# Patient Record
Sex: Male | Born: 1935 | Race: White | Hispanic: No | Marital: Married | Smoking: Never smoker
Health system: Southern US, Community
[De-identification: ages and names within clinical notes are randomized; demographics above are authoritative.]

## PROBLEM LIST (undated history)

## (undated) DIAGNOSIS — Z7901 Long term (current) use of anticoagulants: Secondary | ICD-10-CM

## (undated) DIAGNOSIS — E7800 Pure hypercholesterolemia, unspecified: Secondary | ICD-10-CM

## (undated) DIAGNOSIS — E785 Hyperlipidemia, unspecified: Secondary | ICD-10-CM

## (undated) DIAGNOSIS — I10 Essential (primary) hypertension: Secondary | ICD-10-CM

## (undated) DIAGNOSIS — R0600 Dyspnea, unspecified: Secondary | ICD-10-CM

## (undated) DIAGNOSIS — L719 Rosacea, unspecified: Secondary | ICD-10-CM

## (undated) DIAGNOSIS — M5030 Other cervical disc degeneration, unspecified cervical region: Secondary | ICD-10-CM

## (undated) DIAGNOSIS — I428 Other cardiomyopathies: Secondary | ICD-10-CM

## (undated) DIAGNOSIS — I4891 Unspecified atrial fibrillation: Secondary | ICD-10-CM

## (undated) DIAGNOSIS — N289 Disorder of kidney and ureter, unspecified: Secondary | ICD-10-CM

## (undated) DIAGNOSIS — N529 Male erectile dysfunction, unspecified: Secondary | ICD-10-CM

## (undated) HISTORY — DX: Other cervical disc degeneration, unspecified cervical region: M50.30

## (undated) HISTORY — PX: TONSILLECTOMY: SHX5217

## (undated) HISTORY — DX: Male erectile dysfunction, unspecified: N52.9

## (undated) HISTORY — DX: Unspecified atrial fibrillation: I48.91

## (undated) HISTORY — PX: CONTROL HEMORRHAGE NASAL: SUR313

## (undated) HISTORY — DX: Essential (primary) hypertension: I10

## (undated) HISTORY — DX: Dyspnea, unspecified: R06.00

## (undated) HISTORY — DX: Long term (current) use of anticoagulants: Z79.01

## (undated) HISTORY — DX: Other cardiomyopathies: I42.8

## (undated) HISTORY — DX: Hyperlipidemia, unspecified: E78.5

## (undated) HISTORY — DX: Rosacea, unspecified: L71.9

## (undated) HISTORY — PX: APPENDECTOMY: SHX54

## (undated) HISTORY — DX: Pure hypercholesterolemia, unspecified: E78.00

---

## 1998-08-12 ENCOUNTER — Other Ambulatory Visit: Admission: RE | Admit: 1998-08-12 | Discharge: 1998-08-12 | Payer: Self-pay | Admitting: Podiatry

## 2003-02-11 ENCOUNTER — Inpatient Hospital Stay (HOSPITAL_COMMUNITY): Admission: EM | Admit: 2003-02-11 | Discharge: 2003-02-17 | Payer: Self-pay | Admitting: Emergency Medicine

## 2003-02-12 ENCOUNTER — Encounter: Payer: Self-pay | Admitting: Cardiology

## 2003-02-22 ENCOUNTER — Inpatient Hospital Stay (HOSPITAL_COMMUNITY): Admission: AD | Admit: 2003-02-22 | Discharge: 2003-02-24 | Payer: Self-pay | Admitting: Cardiovascular Disease

## 2003-05-14 ENCOUNTER — Ambulatory Visit (HOSPITAL_COMMUNITY): Admission: RE | Admit: 2003-05-14 | Discharge: 2003-05-14 | Payer: Self-pay | Admitting: Cardiovascular Disease

## 2003-07-24 ENCOUNTER — Ambulatory Visit (HOSPITAL_BASED_OUTPATIENT_CLINIC_OR_DEPARTMENT_OTHER): Admission: RE | Admit: 2003-07-24 | Discharge: 2003-07-24 | Payer: Self-pay | Admitting: Urology

## 2003-07-24 ENCOUNTER — Ambulatory Visit (HOSPITAL_COMMUNITY): Admission: RE | Admit: 2003-07-24 | Discharge: 2003-07-24 | Payer: Self-pay | Admitting: Urology

## 2004-01-09 ENCOUNTER — Ambulatory Visit: Payer: Self-pay | Admitting: Family Medicine

## 2004-01-13 ENCOUNTER — Ambulatory Visit: Payer: Self-pay | Admitting: Family Medicine

## 2004-07-22 ENCOUNTER — Ambulatory Visit: Payer: Self-pay | Admitting: Cardiovascular Disease

## 2004-07-24 ENCOUNTER — Ambulatory Visit: Payer: Self-pay | Admitting: Family Medicine

## 2004-08-12 ENCOUNTER — Ambulatory Visit: Payer: Self-pay

## 2004-08-13 ENCOUNTER — Ambulatory Visit: Payer: Self-pay | Admitting: Cardiovascular Disease

## 2004-08-13 ENCOUNTER — Ambulatory Visit: Payer: Self-pay

## 2004-10-29 ENCOUNTER — Ambulatory Visit: Payer: Self-pay | Admitting: Cardiovascular Disease

## 2005-05-06 ENCOUNTER — Ambulatory Visit: Payer: Self-pay | Admitting: Cardiovascular Disease

## 2005-05-25 ENCOUNTER — Ambulatory Visit: Payer: Self-pay

## 2005-05-25 ENCOUNTER — Encounter: Payer: Self-pay | Admitting: Cardiology

## 2005-06-01 ENCOUNTER — Ambulatory Visit: Payer: Self-pay

## 2005-06-07 ENCOUNTER — Ambulatory Visit: Payer: Self-pay | Admitting: Cardiovascular Disease

## 2005-06-14 ENCOUNTER — Ambulatory Visit: Payer: Self-pay | Admitting: Internal Medicine

## 2005-06-29 ENCOUNTER — Ambulatory Visit: Payer: Self-pay | Admitting: Cardiology

## 2005-07-08 ENCOUNTER — Ambulatory Visit: Payer: Self-pay | Admitting: Cardiology

## 2005-07-22 ENCOUNTER — Ambulatory Visit: Payer: Self-pay | Admitting: Internal Medicine

## 2005-08-05 ENCOUNTER — Ambulatory Visit: Payer: Self-pay | Admitting: Cardiology

## 2005-08-19 ENCOUNTER — Ambulatory Visit: Payer: Self-pay | Admitting: Internal Medicine

## 2005-09-02 ENCOUNTER — Ambulatory Visit: Payer: Self-pay | Admitting: Family Medicine

## 2005-09-07 ENCOUNTER — Ambulatory Visit: Payer: Self-pay | Admitting: Cardiology

## 2005-09-28 ENCOUNTER — Ambulatory Visit: Payer: Self-pay | Admitting: Cardiovascular Disease

## 2005-10-12 ENCOUNTER — Ambulatory Visit: Payer: Self-pay | Admitting: Cardiology

## 2005-10-22 ENCOUNTER — Ambulatory Visit: Payer: Self-pay | Admitting: Cardiology

## 2005-11-09 ENCOUNTER — Ambulatory Visit: Payer: Self-pay | Admitting: Cardiology

## 2005-11-09 ENCOUNTER — Ambulatory Visit: Payer: Self-pay | Admitting: Family Medicine

## 2005-11-09 LAB — CONVERTED CEMR LAB
ALT: 25 units/L (ref 0–40)
AST: 30 units/L (ref 0–37)
Alkaline Phosphatase: 60 units/L (ref 39–117)
BUN: 10 mg/dL (ref 6–23)
Basophils Absolute: 0 10*3/uL (ref 0.0–0.1)
Basophils Relative: 0.7 % (ref 0.0–1.0)
Calcium: 8.8 mg/dL (ref 8.4–10.5)
Chol/HDL Ratio, serum: 4.2
Cholesterol: 208 mg/dL (ref 0–200)
Creatinine, Ser: 1.1 mg/dL (ref 0.4–1.5)
Eosinophil percent: 3.8 % (ref 0.0–5.0)
Glucose, Bld: 98 mg/dL (ref 70–99)
HCT: 45 % (ref 39.0–52.0)
HDL: 49.5 mg/dL (ref >39.0)
Hemoglobin: 15.1 g/dL (ref 13.0–17.0)
LDL DIRECT: 122.2 mg/dL
Lymphocytes Relative: 21.2 % (ref 12.0–46.0)
MCHC: 33.4 g/dL (ref 30.0–36.0)
MCV: 93.3 fL (ref 78.0–100.0)
Monocytes Absolute: 0.5 10*3/uL (ref 0.2–0.7)
Monocytes Relative: 11 % (ref 3.0–11.0)
Neutro Abs: 2.9 10*3/uL (ref 1.4–7.7)
Neutrophils Relative %: 63.3 % (ref 43.0–77.0)
Platelets: 178 10*3/uL (ref 150–400)
Potassium: 4.1 meq/L (ref 3.5–5.1)
RBC: 4.83 M/uL (ref 4.22–5.81)
RDW: 13.8 % (ref 11.5–14.6)
TSH: 1.81 microintl units/mL (ref 0.35–5.50)
Triglyceride fasting, serum: 157 mg/dL — ABNORMAL HIGH (ref 0–149)
VLDL: 31 mg/dL (ref 0–40)
WBC: 4.6 10*3/uL (ref 4.5–10.5)

## 2005-11-29 ENCOUNTER — Ambulatory Visit: Payer: Self-pay | Admitting: Cardiology

## 2005-12-02 ENCOUNTER — Ambulatory Visit: Payer: Self-pay | Admitting: Cardiovascular Disease

## 2005-12-21 ENCOUNTER — Ambulatory Visit: Payer: Self-pay | Admitting: Cardiology

## 2005-12-30 ENCOUNTER — Ambulatory Visit: Payer: Self-pay | Admitting: Cardiovascular Disease

## 2006-01-14 ENCOUNTER — Ambulatory Visit: Payer: Self-pay | Admitting: Cardiovascular Disease

## 2006-02-15 ENCOUNTER — Ambulatory Visit: Payer: Self-pay | Admitting: *Deleted

## 2006-03-14 ENCOUNTER — Ambulatory Visit: Payer: Self-pay | Admitting: Internal Medicine

## 2006-04-12 ENCOUNTER — Ambulatory Visit: Payer: Self-pay | Admitting: Cardiology

## 2006-05-20 ENCOUNTER — Ambulatory Visit: Payer: Self-pay | Admitting: Cardiology

## 2006-06-06 ENCOUNTER — Ambulatory Visit: Payer: Self-pay | Admitting: Cardiovascular Disease

## 2006-06-15 ENCOUNTER — Ambulatory Visit: Payer: Self-pay | Admitting: Family Medicine

## 2006-06-17 ENCOUNTER — Ambulatory Visit: Payer: Self-pay | Admitting: Internal Medicine

## 2006-07-15 ENCOUNTER — Ambulatory Visit: Payer: Self-pay | Admitting: Cardiovascular Disease

## 2006-08-12 ENCOUNTER — Ambulatory Visit: Payer: Self-pay | Admitting: Internal Medicine

## 2006-09-09 ENCOUNTER — Ambulatory Visit: Payer: Self-pay | Admitting: Cardiology

## 2006-10-07 ENCOUNTER — Ambulatory Visit: Payer: Self-pay | Admitting: Cardiology

## 2006-11-04 ENCOUNTER — Ambulatory Visit: Payer: Self-pay | Admitting: Cardiology

## 2006-12-02 ENCOUNTER — Ambulatory Visit: Payer: Self-pay | Admitting: Cardiology

## 2006-12-13 ENCOUNTER — Ambulatory Visit: Payer: Self-pay | Admitting: Cardiovascular Disease

## 2006-12-30 ENCOUNTER — Ambulatory Visit: Payer: Self-pay | Admitting: Cardiology

## 2007-01-02 ENCOUNTER — Ambulatory Visit: Payer: Self-pay | Admitting: Family Medicine

## 2007-01-02 DIAGNOSIS — E782 Mixed hyperlipidemia: Secondary | ICD-10-CM | POA: Insufficient documentation

## 2007-01-02 DIAGNOSIS — I10 Essential (primary) hypertension: Secondary | ICD-10-CM | POA: Insufficient documentation

## 2007-01-02 DIAGNOSIS — L719 Rosacea, unspecified: Secondary | ICD-10-CM | POA: Insufficient documentation

## 2007-01-02 DIAGNOSIS — I4891 Unspecified atrial fibrillation: Secondary | ICD-10-CM | POA: Insufficient documentation

## 2007-01-02 DIAGNOSIS — N529 Male erectile dysfunction, unspecified: Secondary | ICD-10-CM | POA: Insufficient documentation

## 2007-01-02 LAB — CONVERTED CEMR LAB
ALT: 22 units/L (ref 0–53)
AST: 28 units/L (ref 0–37)
Albumin: 4 g/dL (ref 3.5–5.2)
Alkaline Phosphatase: 72 units/L (ref 39–117)
BUN: 15 mg/dL (ref 6–23)
Basophils Absolute: 0 10*3/uL (ref 0.0–0.1)
Basophils Relative: 0.2 % (ref 0.0–1.0)
Bilirubin, Direct: 0.3 mg/dL (ref 0.0–0.3)
CO2: 30 meq/L (ref 19–32)
Calcium: 9.4 mg/dL (ref 8.4–10.5)
Chloride: 100 meq/L (ref 96–112)
Cholesterol: 182 mg/dL (ref 0–200)
Creatinine, Ser: 1.2 mg/dL (ref 0.4–1.5)
Eosinophils Absolute: 0.2 10*3/uL (ref 0.0–0.6)
Eosinophils Relative: 2.3 % (ref 0.0–5.0)
GFR calc Af Amer: 77 mL/min
GFR calc non Af Amer: 63 mL/min
Glucose, Bld: 99 mg/dL (ref 70–99)
HCT: 42.8 % (ref 39.0–52.0)
HDL: 42.9 mg/dL (ref >39.0)
Hemoglobin: 14.9 g/dL (ref 13.0–17.0)
LDL Cholesterol: 111 mg/dL — ABNORMAL HIGH (ref 0–99)
Lymphocytes Relative: 19 % (ref 12.0–46.0)
MCHC: 34.8 g/dL (ref 30.0–36.0)
MCV: 92.1 fL (ref 78.0–100.0)
Monocytes Absolute: 0.6 10*3/uL (ref 0.2–0.7)
Monocytes Relative: 8.7 % (ref 3.0–11.0)
Neutro Abs: 4.9 10*3/uL (ref 1.4–7.7)
Neutrophils Relative %: 69.8 % (ref 43.0–77.0)
Platelets: 197 10*3/uL (ref 150–400)
Potassium: 4.4 meq/L (ref 3.5–5.1)
RBC: 4.65 M/uL (ref 4.22–5.81)
RDW: 13.2 % (ref 11.5–14.6)
Sodium: 138 meq/L (ref 135–145)
TSH: 1.41 microintl units/mL (ref 0.35–5.50)
Total Bilirubin: 1 mg/dL (ref 0.3–1.2)
Total CHOL/HDL Ratio: 4.2
Total Protein: 6.3 g/dL (ref 6.0–8.3)
Triglycerides: 141 mg/dL (ref 0–149)
VLDL: 28 mg/dL (ref 0–40)
WBC: 7 10*3/uL (ref 4.5–10.5)

## 2007-01-27 ENCOUNTER — Ambulatory Visit: Payer: Self-pay | Admitting: Cardiology

## 2007-01-31 ENCOUNTER — Telehealth (INDEPENDENT_AMBULATORY_CARE_PROVIDER_SITE_OTHER): Payer: Self-pay | Admitting: *Deleted

## 2007-02-02 ENCOUNTER — Telehealth (INDEPENDENT_AMBULATORY_CARE_PROVIDER_SITE_OTHER): Payer: Self-pay | Admitting: *Deleted

## 2007-02-24 ENCOUNTER — Ambulatory Visit: Payer: Self-pay | Admitting: Cardiology

## 2007-03-20 ENCOUNTER — Telehealth: Payer: Self-pay | Admitting: Family Medicine

## 2007-03-24 ENCOUNTER — Ambulatory Visit: Payer: Self-pay | Admitting: Internal Medicine

## 2007-04-24 ENCOUNTER — Ambulatory Visit: Payer: Self-pay | Admitting: Cardiology

## 2007-05-26 ENCOUNTER — Ambulatory Visit: Payer: Self-pay | Admitting: Cardiovascular Disease

## 2007-05-29 ENCOUNTER — Ambulatory Visit: Payer: Self-pay | Admitting: Internal Medicine

## 2007-07-03 ENCOUNTER — Ambulatory Visit: Payer: Self-pay | Admitting: Cardiology

## 2007-07-31 ENCOUNTER — Ambulatory Visit: Payer: Self-pay | Admitting: Cardiology

## 2007-09-04 ENCOUNTER — Ambulatory Visit: Payer: Self-pay | Admitting: Cardiology

## 2007-10-03 ENCOUNTER — Ambulatory Visit: Payer: Self-pay | Admitting: Cardiology

## 2007-10-31 ENCOUNTER — Ambulatory Visit: Payer: Self-pay | Admitting: Cardiovascular Disease

## 2007-11-28 ENCOUNTER — Ambulatory Visit: Payer: Self-pay | Admitting: Internal Medicine

## 2007-12-26 ENCOUNTER — Ambulatory Visit: Payer: Self-pay | Admitting: Cardiovascular Disease

## 2008-01-11 ENCOUNTER — Ambulatory Visit: Payer: Self-pay | Admitting: Cardiovascular Disease

## 2008-01-11 ENCOUNTER — Ambulatory Visit: Payer: Self-pay | Admitting: Cardiology

## 2008-01-25 ENCOUNTER — Ambulatory Visit: Payer: Self-pay | Admitting: Internal Medicine

## 2008-02-22 ENCOUNTER — Ambulatory Visit: Payer: Self-pay | Admitting: Internal Medicine

## 2008-02-29 ENCOUNTER — Ambulatory Visit: Payer: Self-pay | Admitting: Family Medicine

## 2008-03-01 LAB — CONVERTED CEMR LAB
ALT: 21 units/L (ref 0–53)
AST: 30 units/L (ref 0–37)
Albumin: 4 g/dL (ref 3.5–5.2)
Alkaline Phosphatase: 51 units/L (ref 39–117)
BUN: 17 mg/dL (ref 6–23)
Basophils Absolute: 0 10*3/uL (ref 0.0–0.1)
Basophils Relative: 0.5 % (ref 0.0–3.0)
Bilirubin, Direct: 0.1 mg/dL (ref 0.0–0.3)
CO2: 0 meq/L — CL (ref 19–32)
Calcium: 9.4 mg/dL (ref 8.4–10.5)
Chloride: 111 meq/L (ref 96–112)
Cholesterol: 202 mg/dL (ref 0–200)
Creatinine, Ser: 1.1 mg/dL (ref 0.4–1.5)
Direct LDL: 119.8 mg/dL
Eosinophils Absolute: 0.1 10*3/uL (ref 0.0–0.7)
Eosinophils Relative: 2.2 % (ref 0.0–5.0)
GFR calc Af Amer: 84 mL/min
GFR calc non Af Amer: 70 mL/min
Glucose, Bld: 94 mg/dL (ref 70–99)
HCT: 42.6 % (ref 39.0–52.0)
HDL: 51.2 mg/dL (ref >39.0)
Hemoglobin: 14.5 g/dL (ref 13.0–17.0)
Lymphocytes Relative: 24.8 % (ref 12.0–46.0)
MCHC: 34.1 g/dL (ref 30.0–36.0)
MCV: 92.5 fL (ref 78.0–100.0)
Monocytes Absolute: 0.4 10*3/uL (ref 0.1–1.0)
Monocytes Relative: 10.2 % (ref 3.0–12.0)
Neutro Abs: 2.6 10*3/uL (ref 1.4–7.7)
Neutrophils Relative %: 62.3 % (ref 43.0–77.0)
Platelets: 165 10*3/uL (ref 150–400)
Potassium: 5.1 meq/L (ref 3.5–5.1)
RBC: 4.6 M/uL (ref 4.22–5.81)
RDW: 13.4 % (ref 11.5–14.6)
Sodium: 145 meq/L (ref 135–145)
TSH: 1.54 microintl units/mL (ref 0.35–5.50)
Total Bilirubin: 1 mg/dL (ref 0.3–1.2)
Total CHOL/HDL Ratio: 3.9
Total Protein: 6.6 g/dL (ref 6.0–8.3)
Triglycerides: 124 mg/dL (ref 0–149)
VLDL: 25 mg/dL (ref 0–40)
WBC: 4.1 10*3/uL — ABNORMAL LOW (ref 4.5–10.5)

## 2008-03-21 ENCOUNTER — Ambulatory Visit: Payer: Self-pay | Admitting: Internal Medicine

## 2008-04-11 ENCOUNTER — Ambulatory Visit: Payer: Self-pay | Admitting: Internal Medicine

## 2008-04-25 ENCOUNTER — Ambulatory Visit: Payer: Self-pay | Admitting: Cardiovascular Disease

## 2008-05-01 ENCOUNTER — Ambulatory Visit: Payer: Self-pay | Admitting: Family Medicine

## 2008-05-08 LAB — CONVERTED CEMR LAB
Fecal Occult Blood: NEGATIVE
OCCULT 1: NEGATIVE
OCCULT 2: NEGATIVE
OCCULT 3: NEGATIVE
OCCULT 4: NEGATIVE
OCCULT 5: NEGATIVE

## 2008-05-14 ENCOUNTER — Ambulatory Visit: Payer: Self-pay | Admitting: Cardiology

## 2008-06-13 ENCOUNTER — Ambulatory Visit: Payer: Self-pay | Admitting: Internal Medicine

## 2008-06-25 ENCOUNTER — Encounter: Payer: Self-pay | Admitting: *Deleted

## 2008-07-08 ENCOUNTER — Ambulatory Visit: Payer: Self-pay | Admitting: Cardiology

## 2008-07-08 ENCOUNTER — Telehealth: Payer: Self-pay | Admitting: Family Medicine

## 2008-07-08 ENCOUNTER — Ambulatory Visit: Payer: Self-pay | Admitting: Family Medicine

## 2008-07-08 DIAGNOSIS — M5020 Other cervical disc displacement, unspecified cervical region: Secondary | ICD-10-CM

## 2008-07-08 LAB — CONVERTED CEMR LAB
POC INR: 2.7
Protime: 19.8

## 2008-07-18 ENCOUNTER — Encounter: Admission: RE | Admit: 2008-07-18 | Discharge: 2008-08-29 | Payer: Self-pay | Admitting: Family Medicine

## 2008-07-30 ENCOUNTER — Encounter (INDEPENDENT_AMBULATORY_CARE_PROVIDER_SITE_OTHER): Payer: Self-pay | Admitting: Cardiology

## 2008-07-30 ENCOUNTER — Ambulatory Visit: Payer: Self-pay | Admitting: Cardiology

## 2008-07-30 LAB — CONVERTED CEMR LAB
POC INR: 4.7
Prothrombin Time: 26.4 s

## 2008-07-31 ENCOUNTER — Encounter: Payer: Self-pay | Admitting: *Deleted

## 2008-08-01 ENCOUNTER — Telehealth (INDEPENDENT_AMBULATORY_CARE_PROVIDER_SITE_OTHER): Payer: Self-pay | Admitting: *Deleted

## 2008-08-06 ENCOUNTER — Encounter: Payer: Self-pay | Admitting: Cardiology

## 2008-08-06 ENCOUNTER — Encounter (INDEPENDENT_AMBULATORY_CARE_PROVIDER_SITE_OTHER): Payer: Self-pay | Admitting: Cardiology

## 2008-09-02 ENCOUNTER — Ambulatory Visit: Payer: Self-pay | Admitting: Cardiology

## 2008-09-02 LAB — CONVERTED CEMR LAB
POC INR: 2.8
Prothrombin Time: 20.2 s

## 2008-09-18 DIAGNOSIS — R0602 Shortness of breath: Secondary | ICD-10-CM | POA: Insufficient documentation

## 2008-09-19 ENCOUNTER — Ambulatory Visit: Payer: Self-pay | Admitting: Cardiovascular Disease

## 2008-10-01 ENCOUNTER — Ambulatory Visit: Payer: Self-pay | Admitting: Cardiology

## 2008-10-01 LAB — CONVERTED CEMR LAB: POC INR: 3.2

## 2008-11-08 ENCOUNTER — Ambulatory Visit: Payer: Self-pay | Admitting: Internal Medicine

## 2008-11-08 LAB — CONVERTED CEMR LAB: POC INR: 3.5

## 2008-12-09 ENCOUNTER — Ambulatory Visit: Payer: Self-pay | Admitting: Cardiology

## 2008-12-09 LAB — CONVERTED CEMR LAB: POC INR: 2.9

## 2008-12-17 ENCOUNTER — Encounter (INDEPENDENT_AMBULATORY_CARE_PROVIDER_SITE_OTHER): Payer: Self-pay | Admitting: *Deleted

## 2009-01-07 ENCOUNTER — Ambulatory Visit: Payer: Self-pay | Admitting: Cardiology

## 2009-01-07 LAB — CONVERTED CEMR LAB: POC INR: 3.2

## 2009-02-06 ENCOUNTER — Ambulatory Visit: Payer: Self-pay | Admitting: Internal Medicine

## 2009-02-06 LAB — CONVERTED CEMR LAB: POC INR: 3.2

## 2009-03-06 ENCOUNTER — Ambulatory Visit: Payer: Self-pay | Admitting: Cardiology

## 2009-03-06 ENCOUNTER — Encounter (INDEPENDENT_AMBULATORY_CARE_PROVIDER_SITE_OTHER): Payer: Self-pay | Admitting: *Deleted

## 2009-03-06 ENCOUNTER — Ambulatory Visit: Payer: Self-pay | Admitting: Family Medicine

## 2009-03-06 ENCOUNTER — Encounter (INDEPENDENT_AMBULATORY_CARE_PROVIDER_SITE_OTHER): Payer: Self-pay | Admitting: Cardiology

## 2009-03-06 LAB — CONVERTED CEMR LAB: POC INR: 2.9

## 2009-03-07 LAB — CONVERTED CEMR LAB
ALT: 34 units/L (ref 0–53)
AST: 34 units/L (ref 0–37)
Albumin: 4.1 g/dL (ref 3.5–5.2)
Alkaline Phosphatase: 62 units/L (ref 39–117)
BUN: 11 mg/dL (ref 6–23)
Basophils Absolute: 0 10*3/uL (ref 0.0–0.1)
Basophils Relative: 0.5 % (ref 0.0–3.0)
Bilirubin, Direct: 0.1 mg/dL (ref 0.0–0.3)
CO2: 32 meq/L (ref 19–32)
Calcium: 9 mg/dL (ref 8.4–10.5)
Chloride: 103 meq/L (ref 96–112)
Cholesterol: 209 mg/dL — ABNORMAL HIGH (ref 0–200)
Creatinine, Ser: 1 mg/dL (ref 0.4–1.5)
Direct LDL: 128.1 mg/dL
Eosinophils Absolute: 0.2 10*3/uL (ref 0.0–0.7)
Eosinophils Relative: 3.5 % (ref 0.0–5.0)
GFR calc non Af Amer: 77.62 mL/min (ref >60)
Glucose, Bld: 88 mg/dL (ref 70–99)
HCT: 42.3 % (ref 39.0–52.0)
HDL: 58.3 mg/dL (ref >39.00)
Hemoglobin: 14.2 g/dL (ref 13.0–17.0)
Lymphocytes Relative: 24.3 % (ref 12.0–46.0)
Lymphs Abs: 1.1 10*3/uL (ref 0.7–4.0)
MCHC: 33.6 g/dL (ref 30.0–36.0)
MCV: 92.9 fL (ref 78.0–100.0)
Monocytes Absolute: 0.5 10*3/uL (ref 0.1–1.0)
Monocytes Relative: 11 % (ref 3.0–12.0)
Neutro Abs: 2.7 10*3/uL (ref 1.4–7.7)
Neutrophils Relative %: 60.7 % (ref 43.0–77.0)
Platelets: 132 10*3/uL — ABNORMAL LOW (ref 150.0–400.0)
Potassium: 4.3 meq/L (ref 3.5–5.1)
RBC: 4.55 M/uL (ref 4.22–5.81)
RDW: 14.2 % (ref 11.5–14.6)
Sodium: 142 meq/L (ref 135–145)
TSH: 2.06 microintl units/mL (ref 0.35–5.50)
Total Bilirubin: 1 mg/dL (ref 0.3–1.2)
Total CHOL/HDL Ratio: 4
Total Protein: 6.4 g/dL (ref 6.0–8.3)
Triglycerides: 166 mg/dL — ABNORMAL HIGH (ref 0.0–149.0)
VLDL: 33.2 mg/dL (ref 0.0–40.0)
WBC: 4.5 10*3/uL (ref 4.5–10.5)

## 2009-04-01 ENCOUNTER — Encounter (INDEPENDENT_AMBULATORY_CARE_PROVIDER_SITE_OTHER): Payer: Self-pay | Admitting: *Deleted

## 2009-04-01 ENCOUNTER — Ambulatory Visit: Payer: Self-pay | Admitting: Gastroenterology

## 2009-04-01 DIAGNOSIS — K625 Hemorrhage of anus and rectum: Secondary | ICD-10-CM

## 2009-04-03 ENCOUNTER — Ambulatory Visit: Payer: Self-pay | Admitting: Cardiology

## 2009-04-03 LAB — CONVERTED CEMR LAB: POC INR: 2.5

## 2009-04-07 ENCOUNTER — Telehealth: Payer: Self-pay | Admitting: Family Medicine

## 2009-04-14 ENCOUNTER — Telehealth (INDEPENDENT_AMBULATORY_CARE_PROVIDER_SITE_OTHER): Payer: Self-pay | Admitting: *Deleted

## 2009-04-15 ENCOUNTER — Ambulatory Visit: Payer: Self-pay | Admitting: Gastroenterology

## 2009-04-18 ENCOUNTER — Encounter: Payer: Self-pay | Admitting: Gastroenterology

## 2009-04-28 ENCOUNTER — Telehealth: Payer: Self-pay | Admitting: Family Medicine

## 2009-04-29 ENCOUNTER — Ambulatory Visit: Payer: Self-pay | Admitting: Internal Medicine

## 2009-04-29 LAB — CONVERTED CEMR LAB: POC INR: 2.9

## 2009-05-11 ENCOUNTER — Emergency Department (HOSPITAL_COMMUNITY): Admission: EM | Admit: 2009-05-11 | Discharge: 2009-05-11 | Payer: Self-pay | Admitting: Emergency Medicine

## 2009-05-27 ENCOUNTER — Ambulatory Visit: Payer: Self-pay | Admitting: Cardiology

## 2009-05-27 LAB — CONVERTED CEMR LAB: POC INR: 3.7

## 2009-06-24 ENCOUNTER — Ambulatory Visit: Payer: Self-pay | Admitting: Internal Medicine

## 2009-06-24 LAB — CONVERTED CEMR LAB: POC INR: 2.2

## 2009-07-14 ENCOUNTER — Encounter (INDEPENDENT_AMBULATORY_CARE_PROVIDER_SITE_OTHER): Payer: Self-pay | Admitting: *Deleted

## 2009-07-22 ENCOUNTER — Ambulatory Visit: Payer: Self-pay | Admitting: Internal Medicine

## 2009-07-22 LAB — CONVERTED CEMR LAB: POC INR: 2.6

## 2009-08-13 ENCOUNTER — Ambulatory Visit: Payer: Self-pay | Admitting: Family Medicine

## 2009-08-13 DIAGNOSIS — M109 Gout, unspecified: Secondary | ICD-10-CM

## 2009-08-15 LAB — CONVERTED CEMR LAB
BUN: 16 mg/dL (ref 6–23)
Basophils Absolute: 0 10*3/uL (ref 0.0–0.1)
Basophils Relative: 0.3 % (ref 0.0–3.0)
CO2: 30 meq/L (ref 19–32)
Calcium: 8.4 mg/dL (ref 8.4–10.5)
Chloride: 101 meq/L (ref 96–112)
Creatinine, Ser: 1 mg/dL (ref 0.4–1.5)
Eosinophils Absolute: 0 10*3/uL (ref 0.0–0.7)
Eosinophils Relative: 0.4 % (ref 0.0–5.0)
GFR calc non Af Amer: 81.27 mL/min (ref >60)
Glucose, Bld: 81 mg/dL (ref 70–99)
HCT: 39.5 % (ref 39.0–52.0)
Hemoglobin: 13.3 g/dL (ref 13.0–17.0)
Lymphocytes Relative: 10.2 % — ABNORMAL LOW (ref 12.0–46.0)
Lymphs Abs: 0.9 10*3/uL (ref 0.7–4.0)
MCHC: 33.8 g/dL (ref 30.0–36.0)
MCV: 93.6 fL (ref 78.0–100.0)
Monocytes Absolute: 0.9 10*3/uL (ref 0.1–1.0)
Monocytes Relative: 9.9 % (ref 3.0–12.0)
Neutro Abs: 7.1 10*3/uL (ref 1.4–7.7)
Neutrophils Relative %: 79.2 % — ABNORMAL HIGH (ref 43.0–77.0)
Platelets: 139 10*3/uL — ABNORMAL LOW (ref 150.0–400.0)
Potassium: 3.5 meq/L (ref 3.5–5.1)
RBC: 4.22 M/uL (ref 4.22–5.81)
RDW: 15.6 % — ABNORMAL HIGH (ref 11.5–14.6)
Sodium: 137 meq/L (ref 135–145)
Uric Acid, Serum: 4.9 mg/dL (ref 4.0–7.8)
WBC: 9 10*3/uL (ref 4.5–10.5)

## 2009-08-19 ENCOUNTER — Ambulatory Visit: Payer: Self-pay | Admitting: Cardiology

## 2009-08-19 LAB — CONVERTED CEMR LAB: POC INR: 3.3

## 2009-09-11 ENCOUNTER — Ambulatory Visit: Payer: Self-pay | Admitting: Cardiovascular Disease

## 2009-09-11 LAB — CONVERTED CEMR LAB: POC INR: 2.5

## 2009-09-22 ENCOUNTER — Ambulatory Visit: Payer: Self-pay | Admitting: Cardiovascular Disease

## 2009-09-22 ENCOUNTER — Ambulatory Visit: Payer: Self-pay

## 2009-09-22 ENCOUNTER — Ambulatory Visit (HOSPITAL_COMMUNITY): Admission: RE | Admit: 2009-09-22 | Discharge: 2009-09-22 | Payer: Self-pay | Admitting: Cardiovascular Disease

## 2009-09-22 ENCOUNTER — Ambulatory Visit: Payer: Self-pay | Admitting: Cardiology

## 2009-10-07 ENCOUNTER — Ambulatory Visit: Payer: Self-pay | Admitting: Cardiovascular Disease

## 2009-10-07 LAB — CONVERTED CEMR LAB: POC INR: 2.7

## 2009-10-31 ENCOUNTER — Telehealth: Payer: Self-pay | Admitting: Family Medicine

## 2009-11-04 ENCOUNTER — Ambulatory Visit: Payer: Self-pay | Admitting: Cardiology

## 2009-11-04 LAB — CONVERTED CEMR LAB: POC INR: 2.8

## 2009-11-12 ENCOUNTER — Telehealth: Payer: Self-pay | Admitting: Family Medicine

## 2009-12-02 ENCOUNTER — Ambulatory Visit: Payer: Self-pay | Admitting: Internal Medicine

## 2009-12-02 LAB — CONVERTED CEMR LAB: POC INR: 2.5

## 2009-12-30 ENCOUNTER — Ambulatory Visit: Payer: Self-pay | Admitting: Cardiovascular Disease

## 2009-12-30 LAB — CONVERTED CEMR LAB: POC INR: 2.2

## 2010-02-03 ENCOUNTER — Ambulatory Visit: Admission: RE | Admit: 2010-02-03 | Discharge: 2010-02-03 | Payer: Self-pay | Source: Home / Self Care

## 2010-02-03 LAB — CONVERTED CEMR LAB: POC INR: 2.3

## 2010-02-14 ENCOUNTER — Encounter: Payer: Self-pay | Admitting: Urology

## 2010-02-24 NOTE — Medication Information (Signed)
Summary: rov/ewj  Anticoagulant Therapy  Managed by: Shelby Dubin, PharmD, BCPS, CPP Referring MD: Charlton Haws MD Supervising MD: Daleen Squibb MD, Maisie Fus Indication 1: Atrial Fibrillation (ICD-427.31) Lab Used: [facility] Site: [facility] INR POC 2.9 INR RANGE 2 - 3  Dietary changes: yes       Details: more greens...  Health status changes: yes       Details: physical this morning  Bleeding/hemorrhagic complications: yes       Details: occasional hemrrhoidal and nasal bleeding..not concerning to patient  Recent/future hospitalizations: no    Any changes in medication regimen? no    Recent/future dental: no  Any missed doses?: no       Is patient compliant with meds? yes       Current Medications (verified): 1)  K-Phos-Neutral [phone] Mg Tabs (K Phos Di & Mono-Sod Phos Mono) .Marland Kitchen.. 1 Tab By Mouth Once Daily As Needed 2)  Metrogel 1 %  Gel (Metronidazole) .... Uad 3)  Simvastatin 10 Mg  Tabs (Simvastatin) .... Take 1 Tab By Mouth At Bedtime 4)  Multivitamins   Tabs (Multiple Vitamin) .Marland Kitchen.. 1 Tab By Mouth Once Daily 5)  Metoprolol Succinate 50 Mg  Tb24 (Metoprolol Succinate) .... 1/2 Tab Two Times A Day 6)  Viagra 100 Mg  Tabs (Sildenafil Citrate) .... Uad 7)  Vitamin A & D 5000-400 Unit Caps (Vitamins A & D) .... Take On Tab Once Daily 8)  Warfarin Sodium 3 Mg Tabs (Warfarin Sodium) .... Use As Directed By Anticoagulation Clinic 9)  Cinnamon 500 Mg Caps (Cinnamon) .... Take One Tab By Mouth Qd 10)  Turmeric Curcumin  Caps (Misc Natural Products) .... Take One Tab By Mouth Qd 11)  Vitamin D 5,000 International Units .... Take One Tab By Mouth Once Daily 12)  Vitamin E 400 Unit Caps (Vitamin E) .... Take One By Mouth Qd 13)  Saw Palmetto 450 Mg Caps (Saw Palmetto (Serenoa Repens)) .Marland Kitchen.. 1 By Mouth Daily 14)  Super Oat Bran 1000 Mg Tabs (Misc Natural Products) .... 850 Mg Dose Daily 15)  M2 Manganese 4 Mg Tabs (Misc Natural Products) .... 2 Mg By Mouth Daily 16)  Niacin Cr 1000 Mg  Cr-Tabs (Niacin) .... 1/2 Tab By Mouth Once Daily 17)  Gamma-Linc 500 500 Mg Caps (Black Currant Seed Oil) .... 535 Mg Daily 18)  Ginkgo Biloba 120 Mg Caps (Ginkgo Biloba) .Marland Kitchen.. 120 Mg Daily (Sometimes) 19)  Omega-3 350 Mg Caps (Omega-3 Fatty Acids) .... Once Daily 20)  Zinc 100 Mg Tabs (Zinc) .... Once Daily  Allergies (verified): No Known Drug Allergies  Anticoagulation Management History:      The patient is taking warfarin and comes in today for a routine follow up visit.  Positive risk factors for bleeding include an age of 29 years or older.  The bleeding index is 'intermediate risk'.  Positive CHADS2 values include History of HTN.  Negative CHADS2 values include Age > 16 years old.  The start date was 02/16/2003.  Anticoagulation responsible provider: Daleen Squibb MD, Maisie Fus.  INR POC: 2.9.  Cuvette Lot#: [id number].  Exp: 04/2010.    Anticoagulation Management Assessment/Plan:      The patient's current anticoagulation dose is Warfarin sodium 3 mg tabs: Use as directed by Anticoagulation Clinic.  The target INR is 2 - 3.  The next INR is due 04/03/2009.  Anticoagulation instructions were given to patient.  Results were reviewed/authorized by Shelby Dubin, PharmD, BCPS, CPP.  He was notified by Shelby Dubin PharmD, BCPS, CPP.  Prior Anticoagulation Instructions: INR 3.2  Take 1/2 tablet daily then resume same dosage 1.5 tablets daily.  Recheck in 4 weeks.    Current Anticoagulation Instructions: INR 2.9  Please continue taking 1.5 tabs daily and your regular greens intake.   Recheck 4 weeks.

## 2010-02-24 NOTE — Medication Information (Signed)
Summary: Chad Tucker      Allergies Added: NKDA Anticoagulant Therapy  Managed by: Weston Brass, PharmD Referring MD: Charlton Haws MD PCP: Kelle Darting, MD Supervising MD: Graciela Husbands MD, Viviann Spare Indication 1: Atrial Fibrillation (ICD-427.31) Lab Used: [facility] Site: [facility] INR POC 2.5 INR RANGE 2 - 3  Dietary changes: no    Health status changes: no    Bleeding/hemorrhagic complications: yes       Details: [place]osebleeds [place] but bleeding stopped with no problems  Recent/future hospitalizations: no    Any changes in medication regimen? no    Recent/future dental: no  Any missed doses?: no       Is patient compliant with meds? yes       Allergies (verified): No Known Drug Allergies  Anticoagulation Management History:      The patient is taking warfarin and comes in today for a routine follow up visit.  Positive risk factors for bleeding include an age of 75 years or older.  The bleeding index is 'intermediate risk'.  Positive CHADS2 values include History of HTN.  Negative CHADS2 values include Age > 57 years old.  The start date was 02/16/2003.  Anticoagulation responsible provider: Graciela Husbands MD, Viviann Spare.  INR POC: 2.5.  Cuvette Lot#: [id number].  Exp: 11/2010.    Anticoagulation Management Assessment/Plan:      The patient's current anticoagulation dose is Warfarin sodium 3 mg tabs: Use as directed by Anticoagulation Clinic.  The target INR is 2 - 3.  The next INR is due 12/30/2009.  Anticoagulation instructions were given to patient.  Results were reviewed/authorized by Weston Brass, PharmD.  He was notified by Hoy Register, PharmD Candidate.         Prior Anticoagulation Instructions: INR 2.8  Continue same dose of 1 1/2 tablets every day.  Recheck INR in 4 weeks.   Current Anticoagulation Instructions: INR 2.5 Continue previous dose of 1.5 tablets everyday Recheck INR in 4 weeks

## 2010-02-24 NOTE — Progress Notes (Signed)
Summary: Pt req other 2 tubes for Metrogel - [facility] mail order  Phone Note Call from Patient   Caller: Patient Summary of Call: Pt called and said that mail order pharmacy service sent refill req 3 times. Pt is req Metrogel. Need permission to compress script from 30day supply to 90day supply. Pt rcvd 1 tube and is req the other 2. Please call in to [facility] [phone].   Initial call taken by: Lucy Antigua,  April 28, 2009 11:58 AM  Follow-up for Phone Call        whatever!!!!!!!!!!!!!! Follow-up by: Roderick Pee MD,  April 28, 2009 1:14 PM    Prescriptions: METROGEL 1 %  GEL (METRONIDAZOLE) use as directed  #90 days x 3   Entered by:   Kern Reap CMA (AAMA)   Authorized by:   Roderick Pee MD   Signed by:   Kern Reap CMA (AAMA) on 04/28/2009   Method used:   Electronically to        [facility]* (mail-order)       [address] [place], [place]  [address]       Ph: [phone]       Fax: [phone]   RxID:   [id number]

## 2010-02-24 NOTE — Letter (Signed)
Summary: [facility] Instructions  [facility]  [address] [place], [place] [address]   Phone: [phone]  Fax: [phone]       Chad Tucker    [birth year]    MRN: [id number]        Procedure Day /Date: Tuesday, 04/15/09     Arrival Time: 2:00      Procedure Time: 3:00     Location of Procedure:                    Chad Tucker  [facility] (4th Floor)                         PREPARATION FOR COLONOSCOPY WITH MOVIPREP   Starting 5 days prior to your procedure 04/10/09 do not eat nuts, seeds, popcorn, corn, beans, peas,  salads, or any raw vegetables.  Do not take any fiber supplements (e.g. Metamucil, Citrucel, and Benefiber).  THE DAY BEFORE YOUR PROCEDURE         DATE: 04/14/09   DAY: Monday  1.  Drink clear liquids the entire day-NO SOLID FOOD  2.  Do not drink anything colored red or purple.  Avoid juices with pulp.  No orange juice.  3.  Drink at least 64 oz. (8 glasses) of fluid/clear liquids during the day to prevent dehydration and help the prep work efficiently.  CLEAR LIQUIDS INCLUDE: Water Jello Ice Popsicles Tea (sugar ok, no milk/cream) Powdered fruit flavored drinks Coffee (sugar ok, no milk/cream) Gatorade Juice: apple, white grape, white cranberry  Lemonade Clear bullion, consomm, broth Carbonated beverages (any kind) Strained chicken noodle soup Hard Candy                             4.  In the morning, mix first dose of MoviPrep solution:    Empty 1 Pouch A and 1 Pouch B into the disposable container    Add lukewarm drinking water to the top line of the container. Mix to dissolve    Refrigerate (mixed solution should be used within 24 hrs)  5.  Begin drinking the prep at 5:00 p.m. The MoviPrep container is divided by 4 marks.   Every 15 minutes drink the solution down to the next mark (approximately 8 oz) until the full liter is complete.   6.  Follow completed prep with 16 oz of clear liquid of your choice (Nothing  red or purple).  Continue to drink clear liquids until bedtime.  7.  Before going to bed, mix second dose of MoviPrep solution:    Empty 1 Pouch A and 1 Pouch B into the disposable container    Add lukewarm drinking water to the top line of the container. Mix to dissolve    Refrigerate  THE DAY OF YOUR PROCEDURE      DATE: 04/15/09  DAY: Tuesday  Beginning at 10:00 a.m. (5 hours before procedure):         1. Every 15 minutes, drink the solution down to the next mark (approx 8 oz) until the full liter is complete.  2. Follow completed prep with 16 oz. of clear liquid of your choice.    3. You may drink clear liquids until 1:00 (2 HOURS BEFORE PROCEDURE).   MEDICATION INSTRUCTIONS  Unless otherwise instructed, you should take regular prescription medications with a small sip of water   as early as possible the morning  of your procedure.        Stop taking Coumadin on  04/10/09  (5 days before procedure).           OTHER INSTRUCTIONS  You will need a responsible adult at least 75 years of age to accompany you and drive you home.   This person must remain in the waiting room during your procedure.  Wear loose fitting clothing that is easily removed.  Leave jewelry and other valuables at home.  However, you may wish to bring a book to read or  an iPod/MP3 player to listen to music as you wait for your procedure to start.  Remove all body piercing jewelry and leave at home.  Total time from sign-in until discharge is approximately 2-3 hours.  You should go home directly after your procedure and rest.  You can resume normal activities the  day after your procedure.  The day of your procedure you should not:   Drive   Make legal decisions   Operate machinery   Drink alcohol   Return to work  You will receive specific instructions about eating, activities and medications before you leave.    The above instructions have been reviewed and explained to me by    _______________________    I fully understand and can verbalize these instructions _____________________________ Date _________

## 2010-02-24 NOTE — Assessment & Plan Note (Signed)
Summary: pain in ankle and leg/cjr   Vital Signs:  Patient profile:   77 year old male Weight:      240 pounds Temp:     98.7 degrees F oral BP sitting:   140 / 70  (left arm) Cuff size:   regular  Vitals Entered By: Kern Reap CMA Duncan Dull) (August 13, 2009 9:24 AM) CC: right ankle pain, follow up from urology   Primary Care Provider:  Kelle Darting, MD  CC:  right ankle pain and follow up from urology.  History of Present Illness: Chad Tucker is a 77 year old male, who comes in today for evaluation of acute pain and swelling of his right ankle.  The symptoms started on Monday of this week.  No history of trauma.  Both his mother and father had gout.  He, himself has never had a gouty attack.  Allergies: No Known Drug Allergies  Past History:  Past medical, surgical, family and social histories (including risk factors) reviewed for relevance to current acute and chronic problems.  Past Medical History: Reviewed history from 09/18/2008 and no changes required. 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  Past Surgical History: Reviewed history from 09/18/2008 and no changes required. appendectomy tonsillectomy  hemorrhage after nasal septal repair  Family History: Reviewed history from 04/01/2009 and no changes required. Family History of Arthritis mother had Alzheimer's disease, cardiac arrhythmia, migraine headaches, and a thyroidectomy.  Father had osteo- arthritis, rheumatoid arthritis, peptic ulcer disease, depression, and Prete, cancerous skin  lesions.  No brothers or sisters. No FH of Colon Cancer:  Social History: Reviewed history from 01/02/2007 and no changes required. Retired Married Never Smoked Alcohol use-no Drug use-no Regular exercise-yes  Review of Systems      See HPI  Physical Exam  General:  Well-developed,well-nourished,in no acute distress; alert,appropriate and cooperative throughout examination Msk:  right ankle swollen, red, tender, consistent with acute gout   Problems:  Medical Problems Added: 1)  Dx of Gout  (ICD-274.9)  Impression & Recommendations:  Problem # 1:  GOUT (ICD-274.9) Assessment New  His updated medication list for this problem includes:    Allopurinol 300 Mg Tabs (Allopurinol) .Marland Kitchen... Take 1 tablet by mouth every morning  Orders: Venipuncture (21308) TLB-Uric Acid, Blood (84550-URIC) TLB-CBC Platelet - w/Differential (85025-CBCD) TLB-BMP (Basic Metabolic Panel-BMET) (80048-METABOL) Specimen Handling (65784)  Complete Medication List: 1)  K-phos-neutral [phone] Mg Tabs (K phos di & mono-sod phos mono) .Marland Kitchen.. 1 tab by mouth once daily as needed 2)  Metrogel 1 % Gel (Metronidazole) .... Use as directed 3)  Simvastatin 10 Mg Tabs (Simvastatin) .... Take 1 tab by mouth at bedtime 4)  Multivitamins Tabs (Multiple vitamin) .Marland Kitchen.. 1 tab by mouth once daily 5)  Metoprolol Succinate 50 Mg Tb24 (Metoprolol succinate) .... 1/2 tab two times a day 6)  Warfarin Sodium 3 Mg Tabs (Warfarin sodium) .... Use as directed by anticoagulation clinic 7)  Cinnamon 500 Mg Caps (Cinnamon) .... Take one tab by mouth qd 8)  Turmeric Curcumin Caps (Misc natural products) .... Take one tab by mouth qd 9)  Vitamin D 5,000 International Units  .... Take one tab by mouth once daily 10)  Saw Palmetto 450 Mg Caps (Saw palmetto (serenoa repens)) .Marland Kitchen.. 1 by mouth  daily 11)  Super Oat Bran 1000 Mg Tabs (Misc natural products) .... 850 mg dose daily 12)  M2 Manganese 4 Mg Tabs (Misc natural products) .... 2  mg by mouth daily 13)  Niacin Cr 1000 Mg Cr-tabs (Niacin) .... 1/2 tab by mouth once daily 14)  Gamma-linc 500 500 Mg Caps (Black currant seed oil) .... 535 mg daily 15)  Ginkgo Biloba 120 Mg Caps (Ginkgo biloba) .Marland Kitchen.. 120 mg daily (sometimes) 16)  Omega-3 350 Mg Caps (Omega-3 fatty acids) .... 3 caps once daily 17)  Zinc 100 Mg Tabs (Zinc) .... 1/2 tab once daily 18)  Bromelains 500 Mg Tabs (Bromelains) 19)  Choline Bitartrate 250 Mg Tabs (Choline bitartrate) 20)  Lutein 20 Mg Tabs (Lutein) 21)  Resveratrol 100 Mg Caps (Resveratrol) 22)  Dhea 25 Mg Tabs (Prasterone (dhea)) 23)  Folic Acid 800 Mcg Tabs (Folic acid) 24)  Selenium 200 Mcg Tabs (Selenium) 25)  Ginger Root 550 Mg Caps (Ginger (zingiber officinalis)) 26)  Vitamin C 250 Mg Chew (Ascorbic acid) 27)  Boron 3 Mg Caps (Boron) 28)  Yohimbe 500 Mg Caps (Yohimbe bark) .... Take 2 capsules twice aday for 2gram total daily dose 29)  Septra Ds 800-160 Mg Tabs (Sulfamethoxazole-trimethoprim) .... Take one tab by mouth two times a day 30)  Prednisone 20 Mg Tabs (Prednisone) .... Uad 31)  Vicodin Es 7.5-750 Mg Tabs (Hydrocodone-acetaminophen) .... 1/2 to 1 q 4 h. as needed pain 32)  Allopurinol 300 Mg Tabs (Allopurinol) .... Take 1 tablet by mouth every morning  Patient Instructions: 1)  begin prednisone two tabs x 3 days, one x 3 days, a half x 3 days, then half a tablet Monday, Wednesday, Friday, for a two week taper also begin allopurinol 300 mg daily. 2)  I will call you when I get y  lab work back. 3)  Hydrocodone one half to one tablet every 4 to 6 hours as needed for pain.  Also, elevation and ice Prescriptions: PREDNISONE 20 MG TABS (PREDNISONE) UAD  #30 x 1   Entered and Authorized by:   Roderick Pee MD   Signed by:   Roderick Pee MD on 08/13/2009   Method used:   Print then Give to Patient   RxID:   [id number] ALLOPURINOL 300 MG TABS (ALLOPURINOL) Take 1 tablet by mouth every morning  #100 x 3   Entered and  Authorized by:   Roderick Pee MD   Signed by:   Roderick Pee MD on 08/13/2009   Method used:   Print then Give to Patient   RxID:   [id number] VICODIN ES 7.5-750 MG TABS (HYDROCODONE-ACETAMINOPHEN) 1/2 to 1 q 4 h. as needed pain  #30 x 0   Entered and Authorized by:   Roderick Pee MD   Signed by:   Roderick Pee MD on 08/13/2009   Method used:   Print then Give to Patient   RxID:   [id number] PREDNISONE 20 MG TABS (PREDNISONE) UAD  #30 x 1   Entered and Authorized by:   Roderick Pee MD   Signed by:   Roderick Pee MD on 08/13/2009   Method used:   Electronically to        [facility]* (mail-order)       [address] [place], [place]  [address]       Ph: [phone]       Fax: [phone]  RxID:   [id number]

## 2010-02-24 NOTE — Progress Notes (Signed)
Summary: metrogel refill  Phone Note Refill Request Message from:  Fax from Pharmacy on April 07, 2009 5:07 PM  Refills Requested: Medication #1:  METROGEL 1 %  GEL uad  Method Requested: Electronic Initial call taken by: Kern Reap CMA Duncan Dull),  April 07, 2009 5:07 PM    New/Updated Medications: METROGEL 1 %  GEL (METRONIDAZOLE) use as directed Prescriptions: METROGEL 1 %  GEL (METRONIDAZOLE) use as directed  #90 days x 2   Entered by:   Kern Reap CMA (AAMA)   Authorized by:   Roderick Pee MD   Signed by:   Kern Reap CMA (AAMA) on 04/07/2009   Method used:   Electronically to        [facility]* (mail-order)       [address] [place], [place]  [address]       Ph: [phone]       Fax: [phone]   RxID:   [id number]

## 2010-02-24 NOTE — Medication Information (Signed)
Summary: rov/sl  Anticoagulant Therapy  Managed by: Cloyde Reams, RN, BSN Referring MD: Charlton Haws MD PCP: Kelle Darting, MD Supervising MD: Clifton James MD, Cristal Deer Indication 1: Atrial Fibrillation (ICD-427.31) Lab Used: [facility] Site: [facility] INR POC 2.7 INR RANGE 2 - 3  Dietary changes: no    Health status changes: no    Bleeding/hemorrhagic complications: no    Recent/future hospitalizations: no    Any changes in medication regimen? no    Recent/future dental: no  Any missed doses?: no       Is patient compliant with meds? yes       Current Medications (verified): 1)  K-Phos-Neutral [phone] Mg Tabs (K Phos Di & Mono-Sod Phos Mono) .Marland Kitchen.. 1 Tab By Mouth Once Daily As Needed 2)  Metrogel 1 %  Gel (Metronidazole) .... Use As Directed 3)  Simvastatin 10 Mg  Tabs (Simvastatin) .... Take 1 Tab By Mouth At Bedtime 4)  Multivitamins   Tabs (Multiple Vitamin) .Marland Kitchen.. 1 Tab By Mouth Once Daily 5)  Metoprolol Succinate 50 Mg  Tb24 (Metoprolol Succinate) .... 1/2 Tab Two Times A Day 6)  Warfarin Sodium 3 Mg Tabs (Warfarin Sodium) .... Use As Directed By Anticoagulation Clinic 7)  Cinnamon 500 Mg Caps (Cinnamon) .... Take One Tab By Mouth Qd 8)  Turmeric Curcumin  Caps (Misc Natural Products) .... Take One Tab By Mouth Qd 9)  Vitamin D 5,000 International Units .... Take One Tab By Mouth Once Daily 10)  Saw Palmetto 450 Mg Caps (Saw Palmetto (Serenoa Repens)) .Marland Kitchen.. 1 By Mouth Daily 11)  Super Oat Bran 1000 Mg Tabs (Misc Natural Products) .... 850 Mg Dose Daily 12)  M2 Manganese 4 Mg Tabs (Misc Natural Products) .... 2 Mg By Mouth Daily 13)  Niacin Cr 1000 Mg Cr-Tabs (Niacin) .... 1/2 Tab By Mouth Once Daily 14)  Gamma-Linc 500 500 Mg Caps (Black Currant Seed Oil) .... 535 Mg Daily 15)  Ginkgo Biloba 120 Mg Caps (Ginkgo Biloba) .Marland Kitchen.. 120 Mg Daily (Sometimes) 16)  Omega-3 350 Mg Caps (Omega-3 Fatty Acids) .... 3 Caps Once Daily 17)  Zinc 100 Mg Tabs (Zinc) .... 1/2 Tab  Once Daily 18)  Bromelains 500 Mg Tabs (Bromelains) 19)  Choline Bitartrate 250 Mg Tabs (Choline Bitartrate) 20)  Lutein 20 Mg Tabs (Lutein) 21)  Resveratrol 100 Mg Caps (Resveratrol) 22)  Dhea 25 Mg Tabs (Prasterone (Dhea)) 23)  Folic Acid 800 Mcg Tabs (Folic Acid) 24)  Selenium 200 Mcg Tabs (Selenium) 25)  Ginger Root 550 Mg Caps (Ginger (Zingiber Officinalis)) 26)  Vitamin C 250 Mg Chew (Ascorbic Acid) 27)  Boron 3 Mg Caps (Boron) 28)  Yohimbe 500 Mg Caps (Yohimbe Bark) .... Take 2 Capsules Twice Aday For 2gram Total Daily Dose 29)  Septra Ds 800-160 Mg Tabs (Sulfamethoxazole-Trimethoprim) .... Take One Tab By Mouth Two Times A Day 30)  Prednisone 20 Mg Tabs (Prednisone) .... Uad 31)  Vicodin Es 7.5-750 Mg Tabs (Hydrocodone-Acetaminophen) .... 1/2 To 1 Q 4 H. As Needed Pain 32)  Allopurinol 300 Mg Tabs (Allopurinol) .... Take 1 Tablet By Mouth Every Morning  Allergies: No Known Drug Allergies  Anticoagulation Management History:      The patient is taking warfarin and comes in today for a routine follow up visit.  Positive risk factors for bleeding include an age of 16 years or older.  The bleeding index is 'intermediate risk'.  Positive CHADS2 values include History of HTN.  Negative CHADS2 values include Age > 74 years old.  The start date was 02/16/2003.  Anticoagulation responsible provider: Clifton James MD, Cristal Deer.  INR POC: 2.7.  Cuvette Lot#: [id number].  Exp: 11/2010.    Anticoagulation Management Assessment/Plan:      The patient's current anticoagulation dose is Warfarin sodium 3 mg tabs: Use as directed by Anticoagulation Clinic.  The target INR is 2 - 3.  The next INR is due 11/04/2009.  Anticoagulation instructions were given to patient.  Results were reviewed/authorized by Cloyde Reams, RN, BSN.  He was notified by Cloyde Reams RN.         Prior Anticoagulation Instructions: INR 2.5  Continue taking Coumadin 1.5 tabs (4.5 mg) every day. Return to clinic in 4 weeks.    Current Anticoagulation Instructions: INR 2.7  Continue on same dosage 4.5mg  daily.  Recheck in 4 weeks.

## 2010-02-24 NOTE — Procedures (Signed)
Summary: Colonoscopy  Patient: Jamyron Redd Note: All result statuses are Final unless otherwise noted.  Tests: (1) Colonoscopy (COL)   COL Colonoscopy           DONE (C)     [facility]     [address]     [place], [place]  [address]           COLONOSCOPY PROCEDURE REPORT           PATIENT:  Chilton, Sallade  MR#:  [id number]     BIRTHDATE:  1933/07/06, 76 yrs. old  GENDER:  male           ENDOSCOPIST:  Vania Rea. Jarold Motto, MD, [facility]     Referred by:           PROCEDURE DATE:  04/15/2009     PROCEDURE:  Colonoscopy with snare polypectomy     ASA CLASS:  Class III     INDICATIONS:  colorectal cancer screening           MEDICATIONS:   Fentanyl 50 mcg IV, Versed 5 mg IV           DESCRIPTION OF PROCEDURE:   After the risks benefits and     alternatives of the procedure were thoroughly explained, informed     consent was obtained.  Digital rectal exam was performed and     revealed no abnormalities.   The LB CF-H180AL [id number] endoscope     was introduced through the anus and advanced to the cecum, which     was identified by both the appendix and ileocecal valve, without     limitations.  The quality of the prep was good, using MoviPrep.     The instrument was then slowly withdrawn as the colon was fully     examined.     <<PROCEDUREIMAGES>>           FINDINGS:  Moderate diverticulosis was found sigmoid to descending     RED,THICKENED HAUSTRAL FOLDS NOTED.  A diminutive polyp was found     in the sigmoid colon. COLD SNARE EXCISED.NO BLEEDING NOTED.     Retroflexed views in the rectum revealed no abnormalities.    The     scope was then withdrawn from the patient and the procedure     completed.           COMPLICATIONS:  None           ENDOSCOPIC IMPRESSION:     1) Moderate diverticulosis in the sigmoid to descending     2) Diminutive polyp in the sigmoid colon     R/O ADENOMA.RESUME COUMADIN.     RECOMMENDATIONS:     1) high fiber diet     2) Await biopsy  results           REPEAT EXAM:  No           ______________________________     Vania Rea. Jarold Motto, MD, [facility] Graham           CC:  Wendall Stade, MDJeffrey Shawnie Dapper, MD           n.     REVISED:  04/28/2009 03:27 PM     eSIGNED:   Vania Rea. Patterson at 04/28/2009 03:27 PM           Tyson Alias, [id number]  Note: An exclamation mark (!) indicates a result that was not dispersed into the flowsheet. Document Creation Date: 04/28/2009 3:27 PM _______________________________________________________________________  Marland Kitchen  1) Order result status: Final Collection or observation date-time: 04/15/2009 16:00 Requested date-time:  Receipt date-time:  Reported date-time:  Referring Physician:   Ordering Physician: Sheryn Bison [phone]) Specimen Source:  Source: Launa Grill Order Number: [id number] Lab site:

## 2010-02-24 NOTE — Assessment & Plan Note (Signed)
Summary: SCREEN FOR COLON-ON COUMADIN/YF    History of Present Illness Visit Type: consult  Primary GI MD: Sheryn Bison MD FACP FAGA Primary Provider: Kelle Darting, MD Requesting Provider: Kelle Darting, MD Chief Complaint: Consult colon. Pt c/o rectal bleeding and diarrhea  History of Present Illness:   77 year old Caucasian male with recurrent atrial fibrillation well controlled on daily metaprolol and chronic Coumadin therapy followed by Dr. Charlton Haws in cardiology and primary care doctor Kelle Darting. He has had some intermittent bright red blood per rectum over the last year has never had colonoscopy or barium studies. There is a vague history of melanotic stool year ago without any upper GI symptoms. Apparently Hemoccult cards were negative and he has had no history of anemia.  He relates his INR is usually between 2 and 3. He denies abdominal pain or bowel irregularity. Family history is noncontributory his only child. It is possible his father may have had colon polyps. Other medical problems include essential hypertension, hyperlipidemia, rosacea, and degenerative joint disease of the spine. He is not on aspirin or NSAIDs. He denies abuse of alcohol or cigarettes. His appetite is good his weight is been stable. He denies any specific food intolerances.   GI Review of Systems      Denies abdominal pain, acid reflux, belching, bloating, chest pain, dysphagia with liquids, dysphagia with solids, heartburn, loss of appetite, nausea, vomiting, vomiting blood, weight loss, and  weight gain.      Reports diarrhea and  rectal bleeding.     Denies anal fissure, black tarry stools, change in bowel habit, constipation, diverticulosis, fecal incontinence, heme positive stool, hemorrhoids, irritable bowel syndrome, jaundice, light color stool, liver problems, and  rectal pain.    Current Medications (verified): 1)  K-Phos-Neutral [phone] Mg Tabs (K Phos Di & Mono-Sod Phos Mono) .Marland Kitchen.. 1  Tab By Mouth Once Daily As Needed 2)  Metrogel 1 %  Gel (Metronidazole) .... Uad 3)  Simvastatin 10 Mg  Tabs (Simvastatin) .... Take 1 Tab By Mouth At Bedtime 4)  Multivitamins   Tabs (Multiple Vitamin) .Marland Kitchen.. 1 Tab By Mouth Once Daily 5)  Metoprolol Succinate 50 Mg  Tb24 (Metoprolol Succinate) .... 1/2 Tab Two Times A Day 6)  Viagra 100 Mg  Tabs (Sildenafil Citrate) .... Uad 7)  Vitamin A & D 5000-400 Unit Caps (Vitamins A & D) .... Take On Tab Once Daily 8)  Warfarin Sodium 3 Mg Tabs (Warfarin Sodium) .... Use As Directed By Anticoagulation Clinic 9)  Cinnamon 500 Mg Caps (Cinnamon) .... Take One Tab By Mouth Qd 10)  Turmeric Curcumin  Caps (Misc Natural Products) .... Take One Tab By Mouth Qd 11)  Vitamin D 5,000 International Units .... Take One Tab By Mouth Once Daily 12)  Vitamin E 400 Unit Caps (Vitamin E) .... Take One By Mouth Qd 13)  Saw Palmetto 450 Mg Caps (Saw Palmetto (Serenoa Repens)) .Marland Kitchen.. 1 By Mouth Daily 14)  Super Oat Bran 1000 Mg Tabs (Misc Natural Products) .... 850 Mg Dose Daily 15)  M2 Manganese 4 Mg Tabs (Misc Natural Products) .... 2 Mg By Mouth Daily 16)  Niacin Cr 1000 Mg Cr-Tabs (Niacin) .... 1/2 Tab By Mouth Once Daily 17)  Gamma-Linc 500 500 Mg Caps (Black Currant Seed Oil) .... 535 Mg Daily 18)  Ginkgo Biloba 120 Mg Caps (Ginkgo Biloba) .Marland Kitchen.. 120 Mg Daily (Sometimes) 19)  Omega-3 350 Mg Caps (Omega-3 Fatty Acids) .... Once Daily 20)  Zinc 100 Mg Tabs (Zinc) .... Once  Daily  Allergies (verified): No Known Drug Allergies  Past History:  Past medical, surgical, family and social histories (including risk factors) reviewed for relevance to current acute and chronic problems.  Past Medical History: Reviewed history from 09/18/2008 and no changes required. Current Problems:  ESSENTIAL HYPERTENSION, BENIGN (ICD-401.1) ATRIAL FIBRILLATION, CHRONIC (ICD-427.31) MIXED HYPERLIPIDEMIA (ICD-272.2) COUMADIN THERAPY (ICD-V58.61) HYPERCHOLESTEROLEMIA (ICD-272.0) DYSPNEA  (ICD-786.05) DEGENERATIVE DISC DISEASE, CERVICAL SPINE, W/RADICULOPATHY (ICD-722.0) ACNE, ROSACEA (ICD-695.3) ERECTILE DYSFUNCTION, ORGANIC (ICD-607.84)  congestive heart failure, secondary to atrial fibrillation Nonischemic cardiomyopathy with an EF of 30%.  Past Surgical History: Reviewed history from 09/18/2008 and no changes required. appendectomy tonsillectomy  hemorrhage after nasal septal repair  Family History: Reviewed history from 01/02/2007 and no changes required. Family History of Arthritis mother had Alzheimer's disease, cardiac arrhythmia, migraine headaches, and a thyroidectomy.  Father had osteo- arthritis, rheumatoid arthritis, peptic ulcer disease, depression, and Prete, cancerous skin lesions.  No brothers or sisters. No FH of Colon Cancer:  Social History: Reviewed history from 01/02/2007 and no changes required. Retired Married Never Smoked Alcohol use-no Drug use-no Regular exercise-yes  Review of Systems       The patient complains of arthritis/joint pain, fatigue, hearing problems, nosebleeds, shortness of breath, sleeping problems, and vision changes.  The patient denies allergy/sinus, anemia, anxiety-new, back pain, blood in urine, breast changes/lumps, change in vision, confusion, cough, coughing up blood, depression-new, fainting, fever, headaches-new, heart murmur, heart rhythm changes, itching, muscle pains/cramps, night sweats, skin rash, sore throat, swelling of feet/legs, swollen lymph glands, thirst - excessive, urination - excessive, urination changes/pain, urine leakage, and voice change.    Vital Signs:  Patient profile:   78 year old male Height:      61 inches Weight:      236 pounds BMI:     44.75 BSA:     2.03 Pulse rate:   68 / minute Pulse rhythm:   irregular BP sitting:   142 / 80  (left arm) Cuff size:   regular  Vitals Entered By: Ok Anis CMA (April 01, 2009 10:19 AM)  Physical Exam  General:  Well developed, well  nourished, no acute distress.healthy appearing.  healthy appearing.   Head:  Normocephalic and atraumatic. Eyes:  PERRLA, no icterus. Neck:  Supple; no masses or thyromegaly. Lungs:  Clear throughout to auscultation. Heart:  Regular rate and rhythm; no murmurs, rubs,  or bruits.irregular rhythm:.  irregular rhythm:.   Abdomen:  Soft, nontender and nondistended. No masses, hepatosplenomegaly or hernias noted. Normal bowel sounds. Rectal:  Normal exam.hemocult negative.  hemocult negative.   Msk:  Symmetrical with no gross deformities. Normal posture. Pulses:  Normal pulses noted. Extremities:  No clubbing, cyanosis, edema or deformities noted. Neurologic:  Alert and  oriented x4;  grossly normal neurologically. Skin:  rosacea and facial area noted. Cervical Nodes:  No significant cervical adenopathy. Inguinal Nodes:  No significant inguinal adenopathy. Psych:  Alert and cooperative. Normal mood and affect.   Impression & Recommendations:  Problem # 1:  ESSENTIAL HYPERTENSION, BENIGN (ICD-401.1) Assessment Improved blood pressure today is normal at 142/80 he is to continue all of his cardiac medications as outlined and reviewed his chart  Problem # 2:  RECTAL BLEEDING (ICD-569.3) Assessment: Unchanged Probable colonic polyposis in a patient on chronic Coumadin therapy. Colonoscopy to be scheduled at his convenience with plans to hold his Coumadin 5 days before the procedure less otherwise advised by cardiology and/or Shelby Dubin in the Coumadin clinic  Problem # 3:  ATRIAL  FIBRILLATION, CHRONIC (ICD-427.31) Assessment: Improved Continue Medications and followup with cardiology as planned.  Problem # 4:  DEGENERATIVE DISC DISEASE, CERVICAL SPINE, W/RADICULOPATHY (ICD-722.0) He denies NSAID use.  Patient Instructions: 1)  You are being scheduled for a colonoscopy. 2)  You will need to stop your coumadin for 5 days before your procedure. 3)  The medication list was reviewed and  reconciled.  All changed / newly prescribed medications were explained.  A complete medication list was provided to the patient / caregiver. 4)  Copy sent to : Dr. Kelle Darting and Dr. Charlton Haws and Shelby Dubin in the Coumadin clinic 5)  Please continue current medications.  6)  Colonoscopy and Flexible Sigmoidoscopy brochure given.  7)  Conscious Sedation brochure given.   Appended Document: SCREEN FOR COLON-ON COUMADIN/YF    Clinical Lists Changes  Medications: Added new medication of MOVIPREP 100 GM  SOLR (PEG-KCL-NACL-NASULF-NA ASC-C) As per prep instructions. - Signed Rx of MOVIPREP 100 GM  SOLR (PEG-KCL-NACL-NASULF-NA ASC-C) As per prep instructions.;  #1 x 0;  Signed;  Entered by: Ashok Cordia RN;  Authorized by: Mardella Layman MD [facility];  Method used: Electronically to [facility] [address], [place], [place], [place]  [address], Ph: [phone], Fax: [phone] Orders: Added new Test order of Colonoscopy (Colon) - Signed    Prescriptions: MOVIPREP 100 GM  SOLR (PEG-KCL-NACL-NASULF-NA ASC-C) As per prep instructions.  #1 x 0   Entered by:   Ashok Cordia RN   Authorized by:   Mardella Layman MD [facility]   Signed by:   Ashok Cordia RN on 04/01/2009   Method used:   Electronically to        [facility] (retail)       [address]       [place], [place]  [address]       Ph: [phone]       Fax: [phone]   RxID:   [id number]

## 2010-02-24 NOTE — Progress Notes (Signed)
Summary: Pt said [facility] sending req for Allpurinoll 300mg   Phone Note Call from Patient Call back at [phone] cell   Caller: Patient Summary of Call: Pt called said that [facility] mail order pharmacy is going to send a fax to Dr. Tawanna Cooler for a duplicate script for Allopurinol 300mg  1 qd. Pt is changing from local pharmacy to mail order only, because it is cheaper.  Pls send to [facility] to [facility] script for  3 month supply with 3 refills.   Initial call taken by: Lucy Antigua,  October 31, 2009 4:13 PM    Prescriptions: ALLOPURINOL 300 MG TABS (ALLOPURINOL) Take 1 tablet by mouth every morning  #100 x 3   Entered by:   Kern Reap CMA (AAMA)   Authorized by:   Roderick Pee MD   Signed by:   Kern Reap CMA (AAMA) on 10/31/2009   Method used:   Electronically to        [facility]* (mail-order)       [address] [place], [place]  [address]       Ph: [phone]       Fax: [phone]   RxID:   [id number]

## 2010-02-24 NOTE — Progress Notes (Signed)
Summary: allopurinol  Phone Note Refill Request Message from:  Fax from Pharmacy on November 12, 2009 8:59 AM  Refills Requested: Medication #1:  ALLOPURINOL 300 MG TABS Take 1 tablet by mouth every morning. Initial call taken by: Kern Reap CMA Duncan Dull),  November 12, 2009 9:01 AM    Prescriptions: ALLOPURINOL 300 MG TABS (ALLOPURINOL) Take 1 tablet by mouth every morning  #100 x 3   Entered by:   Kern Reap CMA (AAMA)   Authorized by:   Roderick Pee MD   Signed by:   Kern Reap CMA (AAMA) on 11/12/2009   Method used:   Faxed to ...       [facility] (mail-order)       [address] [place], [place]  [address]       Ph: [phone]       Fax: [phone]   RxID:   [id number]

## 2010-02-24 NOTE — Letter (Signed)
Summary: Patient Notice- Polyp Results  [facility]  [address] [place], [place] [address]   Phone: [phone]  Fax: [phone]        April 18, 2009 MRN: [id number]    Chad Tucker [address] [place], [place]  [address]    Dear Chad Tucker,  I am pleased to inform you that the colon polyp(s) removed during your recent colonoscopy was (were) found to be benign (no cancer detected) upon pathologic examination.  I recommend you have a repeat colonoscopy examination in 10_ years to look for recurrent polyps, as having colon polyps increases your risk for having recurrent polyps or even colon cancer in the future.  Should you develop new or worsening symptoms of abdominal pain, bowel habit changes or bleeding from the rectum or bowels, please schedule an evaluation with either your primary care physician or with me.  Additional information/recommendations:  xx__ No further action with gastroenterology is needed at this time. Please      follow-up with your primary care physician for your other healthcare      needs.  __ Please call [phone] to schedule a return visit to review your      situation.  __ Please keep your follow-up visit as already scheduled.  __ Continue treatment plan as outlined the day of your exam.  Please call us if you are having persistent problems or have questions about your condition that have not been fully answered at this time.  Sincerely,  Mardella Layman MD [facility]  This letter has been electronically signed by your physician.  Appended Document: Patient Notice- Polyp Results Letter mailed 3.25.11

## 2010-02-24 NOTE — Procedures (Signed)
Summary: Colonoscopy  Patient: Chad Tucker Note: All result statuses are Final unless otherwise noted.  Tests: (1) Colonoscopy (COL)   COL Colonoscopy           DONE     [facility]     [address]     [place], [place]  [address]           COLONOSCOPY PROCEDURE REPORT           PATIENT:  Chad Tucker, Chad Tucker  MR#:  [id number]     BIRTHDATE:  22-Dec-1933, 76 yrs. old  GENDER:  male           ENDOSCOPIST:  Vania Rea. Jarold Motto, MD, [facility]     Referred by:           PROCEDURE DATE:  04/15/2009     PROCEDURE:  Colonoscopy with snare polypectomy     ASA CLASS:  Class III     INDICATIONS:  history of pre-cancerous (adenomatous) colon polyps                 MEDICATIONS:   Fentanyl 50 mcg IV, Versed 5 mg IV           DESCRIPTION OF PROCEDURE:   After the risks benefits and     alternatives of the procedure were thoroughly explained, informed     consent was obtained.  Digital rectal exam was performed and     revealed no abnormalities.   The LB CF-H180AL [id number] endoscope     was introduced through the anus and advanced to the cecum, which     was identified by both the appendix and ileocecal valve, without     limitations.  The quality of the prep was good, using MoviPrep.     The instrument was then slowly withdrawn as the colon was fully     examined.     <<PROCEDUREIMAGES>>           FINDINGS:  Moderate diverticulosis was found sigmoid to descending     RED,THICKENED HAUSTRAL FOLDS NOTED.  A diminutive polyp was found     in the sigmoid colon. COLD SNARE EXCISED.NO BLEEDING NOTED.     Retroflexed views in the rectum revealed no abnormalities.    The     scope was then withdrawn from the patient and the procedure     completed.           COMPLICATIONS:  None           ENDOSCOPIC IMPRESSION:     1) Moderate diverticulosis in the sigmoid to descending     2) Diminutive polyp in the sigmoid colon     R/O ADENOMA.RESUME COUMADIN.     RECOMMENDATIONS:     1) high fiber  diet     2) Await biopsy results           REPEAT EXAM:  No           ______________________________     Vania Rea. Jarold Motto, MD, [facility]           CC:  Wendall Stade, MDJeffrey Shawnie Dapper, MD           n.     Rosalie DoctorMarland Kitchen   Vania Rea. Patterson at 04/15/2009 04:08 PM           Tyson Alias, [id number]  Note: An exclamation mark (!) indicates a result that was not dispersed into the flowsheet. Document Creation Date: 04/15/2009 4:08 PM _______________________________________________________________________  Marland Kitchen  1) Order result status: Final Collection or observation date-time: 04/15/2009 16:00 Requested date-time:  Receipt date-time:  Reported date-time:  Referring Physician:   Ordering Physician: Sheryn Bison [phone]) Specimen Source:  Source: Launa Grill Order Number: [id number] Lab site:   Appended Document: Colonoscopy     Procedures Next Due Date:    Colonoscopy: 03/2019

## 2010-02-24 NOTE — Medication Information (Signed)
Summary: rov/ln  Anticoagulant Therapy  Managed by: Reina Fuse, PharmD Referring MD: Charlton Haws MD PCP: Kelle Darting, MD Supervising MD: Excell Seltzer MD, Casimiro Needle Indication 1: Atrial Fibrillation (ICD-427.31) Lab Used: [facility] Site: [facility] INR POC 2.5 INR RANGE 2 - 3  Dietary changes: no     Bleeding/hemorrhagic complications: yes       Details: a few spots of blood from nose. nothing unusual  Recent/future hospitalizations: no    Any changes in medication regimen? yes       Details: allopurinol started for gout  Recent/future dental: no  Any missed doses?: no       Is patient compliant with meds? yes       Allergies: No Known Drug Allergies  Anticoagulation Management History:      The patient is taking warfarin and comes in today for a routine follow up visit.  Positive risk factors for bleeding include an age of 75 years or older.  The bleeding index is 'intermediate risk'.  Positive CHADS2 values include History of HTN.  Negative CHADS2 values include Age > 75 years old.  The start date was 02/16/2003.  Anticoagulation responsible provider: Excell Seltzer MD, Casimiro Needle.  INR POC: 2.5.  Cuvette Lot#: [id number].  Exp: 10/2010.    Anticoagulation Management Assessment/Plan:      The patient's current anticoagulation dose is Warfarin sodium 3 mg tabs: Use as directed by Anticoagulation Clinic.  The target INR is 2 - 3.  The next INR is due 10/07/2009.  Anticoagulation instructions were given to patient.  Results were reviewed/authorized by Reina Fuse, PharmD.  He was notified by Reina Fuse, PharmD.         Prior Anticoagulation Instructions: INR 3.3  Take 1/2 tab today and then continue same regimen of 1.5 tabs daily.  Re-check on 8/18.   Current Anticoagulation Instructions: INR 2.5  Continue taking Coumadin 1.5 tabs (4.5 mg) every day. Return to clinic in 4 weeks.

## 2010-02-24 NOTE — Assessment & Plan Note (Signed)
Summary: F1Y/NEEDS ECHO SAMEDAY/DEBRA             Allergies Added: NKDA  Referring Provider:  Kelle Darting, MD Primary Provider:  Kelle Darting, MD  CC:  check up.  History of Present Illness: Chad Tucker is seen today in followup for anticoagulations atrial fibrillation and hyperlipidemia.  He has no documented coronary artery disease.  Previous echo EF 50-55%   I reviewed his echo from today and EF is normal with mild biatrial enlargement.   He has had no signs of congestive failure.  He gets very infrequent palpitations.  He has not had any chest pain PND orthopnea or lower extremity edema.  His anticoagulation has been therapeutic without bleeding.  He is on statin therapy with normal LFTs.  In February his LDL was approximately 114.  This is within target range for someone with no known coronary disease.  Otherwise he's been doing well.  We discussed Pradaxa but he agreed to wait until an antidote for it is marketed  Current Problems (verified): 1)  Gout  (ICD-274.9) 2)  Rectal Bleeding  (ICD-569.3) 3)  Essential Hypertension, Benign  (ICD-401.1) 4)  Atrial Fibrillation, Chronic  (ICD-427.31) 5)  Mixed Hyperlipidemia  (ICD-272.2) 6)  Coumadin Therapy  (ICD-V58.61) 7)  Hypercholesterolemia  (ICD-272.0) 8)  Dyspnea  (ICD-786.05) 9)  Degenerative Disc Disease, Cervical Spine, W/radiculopathy  (ICD-722.0) 10)  Acne, Rosacea  (ICD-695.3) 11)  Erectile Dysfunction, Organic  (ICD-607.84)  Current Medications (verified): 1)  K-Phos-Neutral [phone] Mg Tabs (K Phos Di & Mono-Sod Phos Mono) .Marland Kitchen.. 1 Tab By Mouth Once Daily As Needed 2)  Metrogel 1 %  Gel (Metronidazole) .... Use As Directed 3)  Simvastatin 10 Mg  Tabs (Simvastatin) .... Take 1 Tab By Mouth At Bedtime 4)  Multivitamins   Tabs (Multiple Vitamin) .Marland Kitchen.. 1 Tab By Mouth Once Daily 5)  Metoprolol Succinate 50 Mg  Tb24 (Metoprolol Succinate) .... 1/2 Tab Two Times A Day 6)  Warfarin Sodium 3 Mg Tabs (Warfarin Sodium) .... Use As  Directed By Anticoagulation Clinic 7)  Cinnamon 500 Mg Caps (Cinnamon) .... Take One Tab By Mouth Qd 8)  Turmeric Curcumin  Caps (Misc Natural Products) .... Take One Tab By Mouth Qd 9)  Vitamin D 5,000 International Units .... Take One Tab By Mouth Once Daily 10)  Saw Palmetto 450 Mg Caps (Saw Palmetto (Serenoa Repens)) .Marland Kitchen.. 1 By Mouth Daily 11)  Super Oat Bran 1000 Mg Tabs (Misc Natural Products) .... 850 Mg Dose Daily 12)  M2 Manganese 4 Mg Tabs (Misc Natural Products) .... 2 Mg By Mouth Daily 13)  Niacin Cr 1000 Mg Cr-Tabs (Niacin) .... 1/2 Tab By Mouth Once Daily 14)  Gamma-Linc 500 500 Mg Caps (Black Currant Seed Oil) .... 535 Mg Daily 15)  Ginkgo Biloba 120 Mg Caps (Ginkgo Biloba) .Marland Kitchen.. 120 Mg Daily (Sometimes) 16)  Omega-3 350 Mg Caps (Omega-3 Fatty Acids) .... 3 Caps Once Daily 17)  Zinc 100 Mg Tabs (Zinc) .... 1/2 Tab Once Daily 18)  Bromelains 500 Mg Tabs (Bromelains) 19)  Choline Bitartrate 250 Mg Tabs (Choline Bitartrate) 20)  Lutein 20 Mg Tabs (Lutein) 21)  Resveratrol 100 Mg Caps (Resveratrol) 22)  Dhea 25 Mg Tabs (Prasterone (Dhea)) 23)  Folic Acid 800 Mcg Tabs (Folic Acid) 24)  Selenium 200 Mcg Tabs (Selenium) 25)  Ginger Root 550 Mg Caps (Ginger (Zingiber Officinalis)) 26)  Vitamin C 250 Mg Chew (Ascorbic Acid) 27)  Boron 3 Mg Caps (Boron) 28)  Yohimbe 500 Mg Caps (Yohimbe  Bark) .... Take 2 Capsules Twice Aday For 2gram Total Daily Dose 29)  Septra Ds 800-160 Mg Tabs (Sulfamethoxazole-Trimethoprim) .... Take One Tab By Mouth Two Times A Day 30)  Prednisone 20 Mg Tabs (Prednisone) .... Uad 31)  Vicodin Es 7.5-750 Mg Tabs (Hydrocodone-Acetaminophen) .... 1/2 To 1 Q 4 H. As Needed Pain 32)  Allopurinol 300 Mg Tabs (Allopurinol) .... Take 1 Tablet By Mouth Every Morning  Allergies (verified): No Known Drug Allergies  Past History:  Past Medical History: Last updated: 09/18/2008 Current Problems:  ESSENTIAL HYPERTENSION, BENIGN (ICD-401.1) ATRIAL FIBRILLATION, CHRONIC  (ICD-427.31) MIXED HYPERLIPIDEMIA (ICD-272.2) COUMADIN THERAPY (ICD-V58.61) HYPERCHOLESTEROLEMIA (ICD-272.0) DYSPNEA (ICD-786.05) DEGENERATIVE DISC DISEASE, CERVICAL SPINE, W/RADICULOPATHY (ICD-722.0) ACNE, ROSACEA (ICD-695.3) ERECTILE DYSFUNCTION, ORGANIC (ICD-607.84)  congestive heart failure, secondary to atrial fibrillation Nonischemic cardiomyopathy with an EF of 30%.  Past Surgical History: Last updated: 09/18/2008 appendectomy tonsillectomy  hemorrhage after nasal septal repair  Family History: Last updated: 04/01/2009 Family History of Arthritis mother had Alzheimer's disease, cardiac arrhythmia, migraine headaches, and a thyroidectomy.  Father had osteo- arthritis, rheumatoid arthritis, peptic ulcer disease, depression, and Prete, cancerous skin lesions.  No brothers or sisters. No FH of Colon Cancer:  Social History: Last updated: 01/02/2007 Retired Married Never Smoked Alcohol use-no Drug use-no Regular exercise-yes  Review of Systems       Denies fever, malais, weight loss, blurry vision, decreased visual acuity, cough, sputum, SOB, hemoptysis, pleuritic pain, palpitaitons, heartburn, abdominal pain, melena, lower extremity edema, claudication, or rash. Recent flair of gout in right foot  Vital Signs:  Patient profile:   77 year old male Height:      61 inches Weight:      233 pounds BMI:     44.18 Pulse rate:   83 / minute Pulse rhythm:   irregularly irregular Resp:     12 per minute BP sitting:   110 / 75  (left arm)  Vitals Entered By: Kem Parkinson (September 22, 2009 1:53 PM)  Physical Exam  General:  Affect appropriate Healthy:  appears stated age HEENT: normal Neck supple with no adenopathy JVP normal no bruits no thyromegaly Lungs clear with no wheezing and good diaphragmatic motion Heart:  S1/S2 no murmur,rub, gallop or click PMI normal Abdomen: benighn, BS positve, no tenderness, no AAA no bruit.  No HSM or HJR Distal pulses intact  with no bruits No edema Neuro non-focal Skin warm and dry    Impression & Recommendations:  Problem # 1:  GOUT (ICD-274.9) Continue allopurinol although uroxitrol may be a better drug with history of kidney stones His updated medication list for this problem includes:    Allopurinol 300 Mg Tabs (Allopurinol) .Marland Kitchen... Take 1 tablet by mouth every morning  Problem # 2:  MIXED HYPERLIPIDEMIA (ICD-272.2)  At goal with no known vascular disease His updated medication list for this problem includes:    Simvastatin 10 Mg Tabs (Simvastatin) .Marland Kitchen... Take 1 tab by mouth at bedtime    Niacin Cr 1000 Mg Cr-tabs (Niacin) .Marland Kitchen... 1/2 tab by mouth once daily  CHOL: 209 (03/06/2009)   LDL: DEL (02/29/2008)   HDL: 58.30 (03/06/2009)   TG: 166.0 (03/06/2009)  His updated medication list for this problem includes:    Simvastatin 10 Mg Tabs (Simvastatin) .Marland Kitchen... Take 1 tab by mouth at bedtime    Niacin Cr 1000 Mg Cr-tabs (Niacin) .Marland Kitchen... 1/2 tab by mouth once daily  Problem # 3:  ATRIAL FIBRILLATION, CHRONIC (ICD-427.31) Assessment: Deteriorated  Good rate control and no bleeding problems  F/U coumadin clinic His updated medication list for this problem includes:    Metoprolol Succinate 50 Mg Tb24 (Metoprolol succinate) .Marland Kitchen... 1/2 tab two times a day    Warfarin Sodium 3 Mg Tabs (Warfarin sodium) ..... Use as directed by anticoagulation clinic  His updated medication list for this problem includes:    Metoprolol Succinate 50 Mg Tb24 (Metoprolol succinate) .Marland Kitchen... 1/2 tab two times a day    Warfarin Sodium 3 Mg Tabs (Warfarin sodium) ..... Use as directed by anticoagulation clinic  Patient Instructions: 1)  Your physician recommends that you schedule a follow-up appointment in: 1 year.   EKG Report  Procedure date:  09/22/2009  Findings:      Afib 83 Otherwise normal ECG

## 2010-02-24 NOTE — Letter (Signed)
Summary: New Patient letter  [facility]  [address] [place], [place] [address]   Phone: [phone]  Fax: [phone]       03/06/2009 MRN: [id number]  Chad Tucker [address] [place], [place]  [address]  Dear Mr. Chad Tucker,  Welcome to the Gastroenterology Division at [facility].    You are scheduled to see Dr.  Jarold Motto on 04-01-09 at 10am on the 3rd floor at [facility], [address]  We ask that you try to arrive at our office 15 minutes prior to your appointment time to allow for check-in.  We would like you to complete the enclosed self-administered evaluation form prior to your visit and bring it with you on the day of your appointment.  We will review it with you.  Also, please bring a complete list of all your medications or, if you prefer, bring the medication bottles and we will list them.  Please bring your insurance card so that we may make a copy of it.  If your insurance requires a referral to see a specialist, please bring your referral form from your primary care physician.  Co-payments are due at the time of your visit and may be paid by cash, check or credit card.     Your office visit will consist of a consult with your physician (includes a physical exam), any laboratory testing he/she may order, scheduling of any necessary diagnostic testing (e.g. x-ray, ultrasound, CT-scan), and scheduling of a procedure (e.g. Endoscopy, Colonoscopy) if required.  Please allow enough time on your schedule to allow for any/all of these possibilities.    If you cannot keep your appointment, please call [phone] to cancel or reschedule prior to your appointment date.  This allows [place] the opportunity to schedule an appointment for another patient in need of care.  If you do not cancel or reschedule by 5 p.m. the business day prior to your appointment date, you will be charged a $50.00 late cancellation/no-show fee.    Thank you for choosing  [facility] for your medical needs.  We appreciate the opportunity to care for you.  Please visit [place] at our website  to learn more about our practice.                     Sincerely,                                                             The Gastroenterology Division

## 2010-02-24 NOTE — Medication Information (Signed)
Summary: Chad Tucker   Anticoagulant Therapy  Managed by: Weston Brass, PharmD Referring MD: Charlton Haws MD PCP: Kelle Darting, MD Supervising MD: Juanda Chance MD, Bruce Indication 1: Atrial Fibrillation (ICD-427.31) Lab Used: [facility] Site: [facility] INR POC 2.8 INR RANGE 2 - 3  Dietary changes: no    Health status changes: no    Bleeding/hemorrhagic complications: no    Recent/future hospitalizations: no    Any changes in medication regimen? no    Recent/future dental: no  Any missed doses?: no       Is patient compliant with meds? yes       Allergies: No Known Drug Allergies  Anticoagulation Management History:      The patient is taking warfarin and comes in today for a routine follow up visit.  Positive risk factors for bleeding include an age of 75 years or older.  The bleeding index is 'intermediate risk'.  Positive CHADS2 values include History of HTN.  Negative CHADS2 values include Age > 75 years old.  The start date was 02/16/2003.  Anticoagulation responsible provider: Juanda Chance MD, Smitty Cords.  INR POC: 2.8.  Cuvette Lot#: [id number].  Exp: 11/2010.    Anticoagulation Management Assessment/Plan:      The patient's current anticoagulation dose is Warfarin sodium 3 mg tabs: Use as directed by Anticoagulation Clinic.  The target INR is 2 - 3.  The next INR is due 12/02/2009.  Anticoagulation instructions were given to patient.  Results were reviewed/authorized by Weston Brass, PharmD.  He was notified by Weston Brass PharmD.         Prior Anticoagulation Instructions: INR 2.7  Continue on same dosage 4.5mg  daily.  Recheck in 4 weeks.    Current Anticoagulation Instructions: INR 2.8  Continue same dose of 1 1/2 tablets every day.  Recheck INR in 4 weeks.

## 2010-02-24 NOTE — Progress Notes (Signed)
   Phone Note Call from Patient   Summary of Call: PT. CALLED [facility] ADMITTING STATING HE JUST READ THE INSTRUCTIONS REGARDING NO BEANS OR NUTS X ONE WEEK. PT. REPORTS THAT HE ATE WALNUTS ON HIS ICECREAM AND CAKE ON 04/13/09. PT. STATING HE ATE A FULL CAN OF BEANS ON FRIDAY AND SOME ALMONDS. PT. STATING HE HAS NOT HAD ANYTHING TO EAT TODAY AND WILL START CLEAR LIQUIDS ONLY. PT. INSTRUCTED TO CALL THE # PROVIDED IN CASE OF DIFFICULTY WITH HIS PREP. PT. STATING "OH I WILL NOT HAVE ANY PROBLEM WITH MY PREP." INFORMED PT. THAT IF HE NEEDED TO DO ANYTHING DIFFERENTLY WE WOULD CALL HIM.Weston Brass RN

## 2010-02-24 NOTE — Letter (Signed)
Summary: Appointment - Reminder 2  [facility], Main Office  [address]   [place], [place] [address]   Phone: [phone]  Fax: [phone]     July 14, 2009 MRN: [id number]   Chad Tucker [address] [place], [place]  [address]   Dear Chad Tucker,  Our records indicate that it is time to schedule a follow-up appointment with Dr. Eden Emms in August. It is very important that we reach you to schedule this appointment. We look forward to participating in your health care needs. Please contact us at the number listed above at your earliest convenience to schedule your appointment.  If you are unable to make an appointment at this time, give [place] a call so we can update our records.     Sincerely,   Migdalia Dk [facility] Scheduling Team

## 2010-02-24 NOTE — Letter (Signed)
Summary: Handout Printed  Printed Handout:  - Coumadin Instructions-w/out Meds 

## 2010-02-24 NOTE — Medication Information (Signed)
Summary: rov/nb  Anticoagulant Therapy  Managed by: Bethena Midget, RN, BSN Referring MD: Charlton Haws MD PCP: Kelle Darting, MD Supervising MD: Eden Emms MD, Theron Arista Indication 1: Atrial Fibrillation (ICD-427.31) Lab Used: [facility] Site: [facility] INR POC 2.2 INR RANGE 2 - 3  Dietary changes: no    Health status changes: no    Bleeding/hemorrhagic complications: no    Recent/future hospitalizations: no    Any changes in medication regimen? no    Recent/future dental: no  Any missed doses?: no       Is patient compliant with meds? yes       Allergies: No Known Drug Allergies  Anticoagulation Management History:      The patient is taking warfarin and comes in today for a routine follow up visit.  Positive risk factors for bleeding include an age of 75 years or older.  The bleeding index is 'intermediate risk'.  Positive CHADS2 values include History of HTN.  Negative CHADS2 values include Age > 16 years old.  The start date was 02/16/2003.  Anticoagulation responsible Chad Tucker: Eden Emms MD, Theron Arista.  INR POC: 2.2.  Cuvette Lot#: [id number].  Exp: 10/2010.    Anticoagulation Management Assessment/Plan:      The patient's current anticoagulation dose is Warfarin sodium 3 mg tabs: Use as directed by Anticoagulation Clinic.  The target INR is 2 - 3.  The next INR is due 02/03/2010.  Anticoagulation instructions were given to patient.  Results were reviewed/authorized by Bethena Midget, RN, BSN.  He was notified by Bethena Midget, RN, BSN.         Prior Anticoagulation Instructions: INR 2.5 Continue previous dose of 1.5 tablets everyday Recheck INR in 4 weeks  Current Anticoagulation Instructions: INR 2.2 Continue 4.5mg s daily. Recheck in 4 weeks.

## 2010-02-24 NOTE — Medication Information (Signed)
Summary: rovm.p  Anticoagulant Therapy  Managed by: Eda Keys, PharmD Referring MD: Charlton Haws MD PCP: Kelle Darting, MD Supervising MD: Daleen Squibb MD, Maisie Fus Indication 1: Atrial Fibrillation (ICD-427.31) Lab Used: [facility] Site: [facility] INR POC 2.5 INR RANGE 2 - 3  Dietary changes: no    Health status changes: no    Bleeding/hemorrhagic complications: no    Recent/future hospitalizations: no    Any changes in medication regimen? no    Recent/future dental: no  Any missed doses?: no       Is patient compliant with meds? yes      Comments: Colonoscopy planned for 04/15/09, instructed by Dr. Jarold Motto to stop taking coumadin on 04/10/09.    Allergies: No Known Drug Allergies  Anticoagulation Management History:      The patient is taking warfarin and comes in today for a routine follow up visit.  Positive risk factors for bleeding include an age of 38 years or older.  The bleeding index is 'intermediate risk'.  Positive CHADS2 values include History of HTN.  Negative CHADS2 values include Age > 75 years old.  The start date was 02/16/2003.  Anticoagulation responsible provider: Daleen Squibb MD, Maisie Fus.  INR POC: 2.5.  Cuvette Lot#: [id number].  Exp: 05/2010.    Anticoagulation Management Assessment/Plan:      The patient's current anticoagulation dose is Warfarin sodium 3 mg tabs: Use as directed by Anticoagulation Clinic.  The target INR is 2 - 3.  The next INR is due 04/29/2009.  Anticoagulation instructions were given to patient.  Results were reviewed/authorized by Eda Keys, PharmD.  He was notified by Eda Keys.         Prior Anticoagulation Instructions: INR 2.9  Please continue taking 1.5 tabs daily and your regular greens intake.   Recheck 4 weeks.    Current Anticoagulation Instructions: INR 2.5  Continue current dosing dosing schedule of 1.5 tablets daily.  Return to clinic in 4 weeks.

## 2010-02-24 NOTE — Medication Information (Signed)
Summary: rov/cb  Anticoagulant Therapy  Managed by: Weston Brass, PharmD Referring MD: Charlton Haws MD PCP: Kelle Darting, MD Supervising MD: Juanda Chance MD, Areon Cocuzza Indication 1: Atrial Fibrillation (ICD-427.31) Lab Used: [facility] Site: [facility] INR POC 3.3 INR RANGE 2 - 3  Dietary changes: no    Health status changes: yes       Details: patient had kidney stone and blood in urine, went to urologist  Bleeding/hemorrhagic complications: no    Recent/future hospitalizations: no    Any changes in medication regimen? yes       Details: started allopurinol this past wednesday and prednisone taper  Recent/future dental: no  Any missed doses?: no       Is patient compliant with meds? yes       Allergies: No Known Drug Allergies  Anticoagulation Management History:      The patient is taking warfarin and comes in today for a routine follow up visit.  Positive risk factors for bleeding include an age of 49 years or older.  The bleeding index is 'intermediate risk'.  Positive CHADS2 values include History of HTN.  Negative CHADS2 values include Age > 40 years old.  The start date was 02/16/2003.  Anticoagulation responsible provider: Juanda Chance MD, Smitty Cords.  INR POC: 3.3.  Cuvette Lot#: [id number].  Exp: 10/2010.    Anticoagulation Management Assessment/Plan:      The patient's current anticoagulation dose is Warfarin sodium 3 mg tabs: Use as directed by Anticoagulation Clinic.  The target INR is 2 - 3.  The next INR is due 09/11/2009.  Anticoagulation instructions were given to patient.  Results were reviewed/authorized by Weston Brass, PharmD.  He was notified by Dillard Cannon.         Prior Anticoagulation Instructions: INR 2.6. Take 1.5 tablets daily. Recheck in 4 weeks.  Current Anticoagulation Instructions: INR 3.3  Take 1/2 tab today and then continue same regimen of 1.5 tabs daily.  Re-check on 8/18.

## 2010-02-24 NOTE — Medication Information (Signed)
Summary: rov/ewj  Anticoagulant Therapy  Managed by: Cloyde Reams, RN, BSN Referring MD: Charlton Haws MD Supervising MD: Ladona Ridgel MD, Sharlot Gowda Indication 1: Atrial Fibrillation (ICD-427.31) Lab Used: [facility] Site: [facility] INR POC 3.2 INR RANGE 2 - 3  Dietary changes: no    Health status changes: no    Bleeding/hemorrhagic complications: yes       Details: Incr nose bleeds in winter months.  Recent/future hospitalizations: no    Any changes in medication regimen? no    Recent/future dental: no  Any missed doses?: no       Is patient compliant with meds? yes       Allergies (verified): No Known Drug Allergies  Anticoagulation Management History:      The patient is taking warfarin and comes in today for a routine follow up visit.  Positive risk factors for bleeding include an age of 75 years or older.  The bleeding index is 'intermediate risk'.  Positive CHADS2 values include History of HTN.  Negative CHADS2 values include Age > 75 years old.  The start date was 02/16/2003.  Anticoagulation responsible provider: Ladona Ridgel MD, Sharlot Gowda.  INR POC: 3.2.  Cuvette Lot#: [id number].  Exp: 04/2010.    Anticoagulation Management Assessment/Plan:      The patient's current anticoagulation dose is Warfarin sodium 3 mg tabs: Use as directed by Anticoagualtion Clinic.  The target INR is 2 - 3.  The next INR is due 03/06/2009.  Anticoagulation instructions were given to patient.  Results were reviewed/authorized by Cloyde Reams, RN, BSN.  He was notified by Cloyde Reams RN.         Prior Anticoagulation Instructions: INR 3.2  Take 3mg  today and eat your green leafy vegetables and then resume 4.5mg  daily.  Recheck in 4 weeks.    Current Anticoagulation Instructions: INR 3.2  Take 1/2 tablet daily then resume same dosage 1.5 tablets daily.  Recheck in 4 weeks.

## 2010-02-24 NOTE — Medication Information (Signed)
Summary: rov/tm  Anticoagulant Therapy  Managed by: Bethena Midget, RN, BSN Referring MD: Charlton Haws MD PCP: Kelle Darting, MD Supervising MD: Graciela Husbands MD, Viviann Spare Indication 1: Atrial Fibrillation (ICD-427.31) Lab Used: [facility] Site: [facility] INR POC 3.7 INR RANGE 2 - 3  Dietary changes: no    Health status changes: no    Bleeding/hemorrhagic complications: no    Recent/future hospitalizations: no    Any changes in medication regimen? no    Recent/future dental: no  Any missed doses?: no       Is patient compliant with meds? yes       Current Medications (verified): 1)  K-Phos-Neutral [phone] Mg Tabs (K Phos Di & Mono-Sod Phos Mono) .Marland Kitchen.. 1 Tab By Mouth Once Daily As Needed 2)  Metrogel 1 %  Gel (Metronidazole) .... Use As Directed 3)  Simvastatin 10 Mg  Tabs (Simvastatin) .... Take 1 Tab By Mouth At Bedtime 4)  Multivitamins   Tabs (Multiple Vitamin) .Marland Kitchen.. 1 Tab By Mouth Once Daily 5)  Metoprolol Succinate 50 Mg  Tb24 (Metoprolol Succinate) .... 1/2 Tab Two Times A Day 6)  Warfarin Sodium 3 Mg Tabs (Warfarin Sodium) .... Use As Directed By Anticoagulation Clinic 7)  Cinnamon 500 Mg Caps (Cinnamon) .... Take One Tab By Mouth Qd 8)  Turmeric Curcumin  Caps (Misc Natural Products) .... Take One Tab By Mouth Qd 9)  Vitamin D 5,000 International Units .... Take One Tab By Mouth Once Daily 10)  Saw Palmetto 450 Mg Caps (Saw Palmetto (Serenoa Repens)) .Marland Kitchen.. 1 By Mouth Daily 11)  Super Oat Bran 1000 Mg Tabs (Misc Natural Products) .... 850 Mg Dose Daily 12)  M2 Manganese 4 Mg Tabs (Misc Natural Products) .... 2 Mg By Mouth Daily 13)  Niacin Cr 1000 Mg Cr-Tabs (Niacin) .... 1/2 Tab By Mouth Once Daily 14)  Gamma-Linc 500 500 Mg Caps (Black Currant Seed Oil) .... 535 Mg Daily 15)  Ginkgo Biloba 120 Mg Caps (Ginkgo Biloba) .Marland Kitchen.. 120 Mg Daily (Sometimes) 16)  Omega-3 350 Mg Caps (Omega-3 Fatty Acids) .... 3 Caps Once Daily 17)  Zinc 100 Mg Tabs (Zinc) .... 1/2 Tab Once  Daily 18)  Bromelains 500 Mg Tabs (Bromelains) 19)  Choline Bitartrate 250 Mg Tabs (Choline Bitartrate) 20)  Lutein 20 Mg Tabs (Lutein) 21)  Resveratrol 100 Mg Caps (Resveratrol) 22)  Dhea 25 Mg Tabs (Prasterone (Dhea)) 23)  Folic Acid 800 Mcg Tabs (Folic Acid) 24)  Selenium 200 Mcg Tabs (Selenium) 25)  Ginger Root 550 Mg Caps (Ginger (Zingiber Officinalis)) 26)  Vitamin C 250 Mg Chew (Ascorbic Acid) 27)  Boron 3 Mg Caps (Boron) 28)  Yohimbe 500 Mg Caps (Yohimbe Bark) .... Take 2 Capsules Twice Aday For 2gram Total Daily Dose  Allergies (verified): No Known Drug Allergies  Anticoagulation Management History:      The patient is taking warfarin and comes in today for a routine follow up visit.  Positive risk factors for bleeding include an age of 75 years or older.  The bleeding index is 'intermediate risk'.  Positive CHADS2 values include History of HTN.  Negative CHADS2 values include Age > 75 years old.  The start date was 02/16/2003.  Anticoagulation responsible provider: Graciela Husbands MD, Viviann Spare.  INR POC: 3.7.  Cuvette Lot#: [id number].  Exp: 05/2010.    Anticoagulation Management Assessment/Plan:      The patient's current anticoagulation dose is Warfarin sodium 3 mg tabs: Use as directed by Anticoagulation Clinic.  The target INR is 2 -  3.  The next INR is due 06/24/2009.  Anticoagulation instructions were given to patient.  Results were reviewed/authorized by Bethena Midget, RN, BSN.         Prior Anticoagulation Instructions: INR 2.9 Continue 4.5mg s daily. Recheck INR in 4 weeks.   Current Anticoagulation Instructions: INR 3.7  Coumadin 4.5mg  = 1 and 1/2 tab each day Add back green vegies to diet

## 2010-02-24 NOTE — Medication Information (Signed)
Summary: rov/tm  Anticoagulant Therapy  Managed by: Elaina Pattee, PharmD Referring MD: Charlton Haws MD PCP: Kelle Darting, MD Supervising MD: Gala Romney MD, Reuel Boom Indication 1: Atrial Fibrillation (ICD-427.31) Lab Used: [facility] Site: [facility] INR POC 2.6 INR RANGE 2 - 3  Dietary changes: yes       Details: Had 2 servings of broccoli, which is more than normal.  Health status changes: no    Bleeding/hemorrhagic complications: yes       Details: Rectal bleeding that appeared to be close to anus and possibly from abrasion. Also had spotting 1x from nose. Neither of these have recurred.  Recent/future hospitalizations: no    Any changes in medication regimen? no    Recent/future dental: no  Any missed doses?: no       Is patient compliant with meds? yes       Allergies: No Known Drug Allergies  Anticoagulation Management History:      The patient is taking warfarin and comes in today for a routine follow up visit.  Positive risk factors for bleeding include an age of 75 years or older.  The bleeding index is 'intermediate risk'.  Positive CHADS2 values include History of HTN.  Negative CHADS2 values include Age > 5 years old.  The start date was 02/16/2003.  Anticoagulation responsible provider: Tanayia Wahlquist MD, Reuel Boom.  INR POC: 2.6.  Cuvette Lot#: [id number].  Exp: 08/2010.    Anticoagulation Management Assessment/Plan:      The patient's current anticoagulation dose is Warfarin sodium 3 mg tabs: Use as directed by Anticoagulation Clinic.  The target INR is 2 - 3.  The next INR is due 08/19/2009.  Anticoagulation instructions were given to patient.  Results were reviewed/authorized by Elaina Pattee, PharmD.  He was notified by Elaina Pattee, PharmD.         Prior Anticoagulation Instructions: INR 2.2 Continue 4.5mg s daily. Recheck in 4 weeks.   Current Anticoagulation Instructions: INR 2.6. Take 1.5 tablets daily. Recheck in 4 weeks.

## 2010-02-24 NOTE — Medication Information (Signed)
Summary: [facility] coumadin - [facility]  Anticoagulant Therapy  Managed by: Bethena Midget, RN, BSN Referring MD: Charlton Haws MD PCP: Kelle Darting, MD Supervising MD: Ladona Ridgel MD, Sharlot Gowda Indication 1: Atrial Fibrillation (ICD-427.31) Lab Used: [facility] Site: [facility] INR POC 2.2 INR RANGE 2 - 3  Dietary changes: no    Health status changes: no    Bleeding/hemorrhagic complications: no    Recent/future hospitalizations: no    Any changes in medication regimen? no    Recent/future dental: no  Any missed doses?: no       Is patient compliant with meds? yes       Allergies: No Known Drug Allergies  Anticoagulation Management History:      The patient is taking warfarin and comes in today for a routine follow up visit.  Positive risk factors for bleeding include an age of 32 years or older.  The bleeding index is 'intermediate risk'.  Positive CHADS2 values include History of HTN.  Negative CHADS2 values include Age > 72 years old.  The start date was 02/16/2003.  Anticoagulation responsible provider: Ladona Ridgel MD, Sharlot Gowda.  INR POC: 2.2.  Cuvette Lot#: [id number].  Exp: 08/2010.    Anticoagulation Management Assessment/Plan:      The patient's current anticoagulation dose is Warfarin sodium 3 mg tabs: Use as directed by Anticoagulation Clinic.  The target INR is 2 - 3.  The next INR is due 07/22/2009.  Anticoagulation instructions were given to patient.  Results were reviewed/authorized by Bethena Midget, RN, BSN.  He was notified by Bethena Midget, RN, BSN.         Prior Anticoagulation Instructions: INR 3.7  Coumadin 4.5mg  = 1 and 1/2 tab each day Add back green vegies to diet  Current Anticoagulation Instructions: INR 2.2 Continue 4.5mg s daily. Recheck in 4 weeks.

## 2010-02-24 NOTE — Medication Information (Signed)
Summary: rov/eac  Anticoagulant Therapy  Managed by: Bethena Midget, RN, BSN Referring MD: Charlton Haws MD PCP: Kelle Darting, MD Supervising MD: Graciela Husbands MD, Viviann Spare Indication 1: Atrial Fibrillation (ICD-427.31) Lab Used: [facility] Site: [facility] INR POC 2.9 INR RANGE 2 - 3  Dietary changes: no    Health status changes: no    Bleeding/hemorrhagic complications: no    Recent/future hospitalizations: no    Any changes in medication regimen? no    Recent/future dental: no  Any missed doses?: no       Is patient compliant with meds? yes       Current Medications (verified): 1)  K-Phos-Neutral [phone] Mg Tabs (K Phos Di & Mono-Sod Phos Mono) .Marland Kitchen.. 1 Tab By Mouth Once Daily As Needed 2)  Metrogel 1 %  Gel (Metronidazole) .... Use As Directed 3)  Simvastatin 10 Mg  Tabs (Simvastatin) .... Take 1 Tab By Mouth At Bedtime 4)  Multivitamins   Tabs (Multiple Vitamin) .Marland Kitchen.. 1 Tab By Mouth Once Daily 5)  Metoprolol Succinate 50 Mg  Tb24 (Metoprolol Succinate) .... 1/2 Tab Two Times A Day 6)  Vitamin A & D 5000-400 Unit Caps (Vitamins A & D) .... Take On Tab Once Daily 7)  Warfarin Sodium 3 Mg Tabs (Warfarin Sodium) .... Use As Directed By Anticoagulation Clinic 8)  Cinnamon 500 Mg Caps (Cinnamon) .... Take One Tab By Mouth Qd 9)  Turmeric Curcumin  Caps (Misc Natural Products) .... Take One Tab By Mouth Qd 10)  Vitamin D 5,000 International Units .... Take One Tab By Mouth Once Daily 11)  Vitamin E 400 Unit Caps (Vitamin E) .... Take One By Mouth Qd 12)  Saw Palmetto 450 Mg Caps (Saw Palmetto (Serenoa Repens)) .Marland Kitchen.. 1 By Mouth Daily 13)  Super Oat Bran 1000 Mg Tabs (Misc Natural Products) .... 850 Mg Dose Daily 14)  M2 Manganese 4 Mg Tabs (Misc Natural Products) .... 2 Mg By Mouth Daily 15)  Niacin Cr 1000 Mg Cr-Tabs (Niacin) .... 1/2 Tab By Mouth Once Daily 16)  Gamma-Linc 500 500 Mg Caps (Black Currant Seed Oil) .... 535 Mg Daily 17)  Ginkgo Biloba 120 Mg Caps (Ginkgo Biloba)  .Marland Kitchen.. 120 Mg Daily (Sometimes) 18)  Omega-3 350 Mg Caps (Omega-3 Fatty Acids) .... Once Daily 19)  Zinc 100 Mg Tabs (Zinc) .... Once Daily 20)  Moviprep 100 Gm  Solr (Peg-Kcl-Nacl-Nasulf-Na Asc-C) .... As Per Prep Instructions. 21)  Bromelains 500 Mg Tabs (Bromelains) 22)  Choline Bitartrate 250 Mg Tabs (Choline Bitartrate) 23)  Lutein 20 Mg Tabs (Lutein) 24)  Resveratrol 100 Mg Caps (Resveratrol) 25)  Dhea 25 Mg Tabs (Prasterone (Dhea)) 26)  Folic Acid 800 Mcg Tabs (Folic Acid) 27)  Selenium 200 Mcg Tabs (Selenium) 28)  Odorless Garlic 1250 Mg Tabs (Garlic) 29)  Ginger Root 550 Mg Caps (Ginger (Zingiber Officinalis)) 30)  Vitamin C 250 Mg Chew (Ascorbic Acid) 31)  Boron 3 Mg Caps (Boron) 32)  Yohimbe 500 Mg Caps (Yohimbe Bark) .... Take 2 Capsules Twice Aday For 2gram Total Daily Dose  Allergies: No Known Drug Allergies  Anticoagulation Management History:      The patient is taking warfarin and comes in today for a routine follow up visit.  Positive risk factors for bleeding include an age of 41 years or older.  The bleeding index is 'intermediate risk'.  Positive CHADS2 values include History of HTN.  Negative CHADS2 values include Age > 61 years old.  The start date was 02/16/2003.  Anticoagulation  responsible provider: Graciela Husbands MD, Viviann Spare.  INR POC: 2.9.  Cuvette Lot#: [id number].  Exp: 05/2010.    Anticoagulation Management Assessment/Plan:      The patient's current anticoagulation dose is Warfarin sodium 3 mg tabs: Use as directed by Anticoagulation Clinic.  The target INR is 2 - 3.  The next INR is due 05/27/2009.  Anticoagulation instructions were given to patient.  Results were reviewed/authorized by Bethena Midget, RN, BSN.  He was notified by Bethena Midget, RN, BSN.         Prior Anticoagulation Instructions: INR 2.5  Continue current dosing dosing schedule of 1.5 tablets daily.  Return to clinic in 4 weeks.    Current Anticoagulation Instructions: INR 2.9 Continue 4.5mg s daily.  Recheck INR in 4 weeks.

## 2010-02-24 NOTE — Assessment & Plan Note (Signed)
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  Multiple issues.  He has underlying hyperlipidemia, which we managed with simvastatin 10 mg nightly Will check lipid panel.  He also has erectile dysfunction, which we treated with Viagra 100 mg p.r.n.  He also has rosacea for which retreat MetroGel 1% nightly.  He also has a history of atrial fibrillation.  He seen in cardiology by Dr. Skeet Latch., cardiac status is stable.  His Coumadin is managed by cardiology.  He also takes a number of roots and herbs that are listed.  He gets routine eye care.  Dental care.  Check.  Colonoscopy is not sure when he had it last period.  Tetanus 2006 Pneumovax 2010 seasonal flu 2010 shingles 2001 he also sees his urologist yearly who does his genital exam and a PSA.  His dermatologist has put them on a cream to p.o. the skin off of his head because of precancerous lesions of the scalp.  Allergies: No Known Drug Allergies  Past History:  Past medical, surgical, family and social histories (including risk factors) reviewed, and no changes noted (except as noted below).  Past Medical History: Reviewed history from 09/18/2008 and no changes required. Current Problems:  ESSENTIAL HYPERTENSION, BENIGN (ICD-401.1) ATRIAL FIBRILLATION, CHRONIC (ICD-427.31) MIXED HYPERLIPIDEMIA (ICD-272.2) COUMADIN THERAPY (ICD-V58.61) HYPERCHOLESTEROLEMIA (ICD-272.0) DYSPNEA (ICD-786.05) DEGENERATIVE DISC DISEASE, CERVICAL SPINE, W/RADICULOPATHY (ICD-722.0) ACNE, ROSACEA (ICD-695.3) ERECTILE DYSFUNCTION, ORGANIC  (ICD-607.84)  congestive heart failure, secondary to atrial fibrillation Nonischemic cardiomyopathy with an EF of 30%.  Past Surgical History: Reviewed history from 09/18/2008 and no changes required. appendectomy tonsillectomy  hemorrhage after nasal septal repair  Family History: Reviewed history from 01/02/2007 and no changes required. Family History of Arthritis mother had Alzheimer's disease, cardiac arrhythmia, migraine headaches, and a thyroidectomy.  Father had osteo- arthritis, rheumatoid arthritis, peptic ulcer disease, depression, and Prete, cancerous skin lesions.  No brothers or sisters.  Social History: Reviewed history from 01/02/2007 and no changes required. Retired Married Never Smoked Alcohol use-no Drug use-no Regular exercise-yes  Physical Exam  General:  Well-developed,well-nourished,in no acute distress; alert,appropriate and cooperative throughout examination Head:  Normocephalic and atraumatic without obvious abnormalities. No apparent alopecia or balding. Eyes:  No corneal or conjunctival inflammation noted. EOMI. Perrla. Funduscopic exam benign, without hemorrhages, exudates or papilledema. Vision grossly normal.bilateral cataracts.  Dense Ears:  External ear exam shows no significant lesions or deformities.  Otoscopic examination reveals clear canals, tympanic membranes are intact bilaterally without bulging, retraction, inflammation or discharge. Hearing is grossly normal bilaterally. Nose:  External nasal examination shows no deformity or inflammation. Nasal mucosa are pink and moist without lesions or exudates. Mouth:  Oral mucosa and oropharynx without lesions or exudates.  Teeth in good repair. Neck:  No deformities, masses, or tenderness noted. Chest Wall:  No deformities, masses, tenderness or gynecomastia noted. Breasts:  No masses or gynecomastia noted Lungs:  Normal respiratory effort, chest expands symmetrically. Lungs are clear to  auscultation, no crackles or wheezes. Heart:  no murmur, no gallop,  no rub, and irregular rhythm.   Abdomen:  Bowel sounds positive,abdomen soft and non-tender without masses, organomegaly or hernias noted. Rectal:  uro Genitalia:  uro Prostate:  uro Msk:  No deformity or scoliosis noted of thoracic or lumbar spine.   Pulses:  R and L carotid,radial,femoral,dorsalis pedis and posterior tibial pulses are full and equal bilaterally Extremities:  No clubbing, cyanosis, edema, or deformity noted with normal full range of motion of all joints.   Neurologic:  No cranial nerve deficits noted. Station and gait are normal. Plantar reflexes are down-going bilaterally. DTRs are symmetrical throughout. Sensory, motor and coordinative functions appear intact. Skin:   total body skin exam normal except for erythematous scalp related to the topical cream from his dermatologist Cervical Nodes:  No lymphadenopathy noted Axillary Nodes:  No palpable lymphadenopathy Inguinal Nodes:  No significant adenopathy Psych:  Cognition and judgment appear intact. Alert and cooperative with normal attention span and concentration. No apparent delusions, illusions, hallucinations   Impression & Recommendations:  Problem # 1:  ESSENTIAL HYPERTENSION, BENIGN (ICD-401.1) Assessment Improved  His updated medication list for this problem includes:    Metoprolol Succinate 50 Mg Tb24 (Metoprolol succinate) .Marland Kitchen... 1/2 tab two times a day  Problem # 2:  MIXED HYPERLIPIDEMIA (ICD-272.2) Assessment: Improved  His updated medication list for this problem includes:    Simvastatin 10 Mg Tabs (Simvastatin) .Marland Kitchen... Take 1 tab by mouth at bedtime    Niacin Cr 1000 Mg Cr-tabs (Niacin) .Marland Kitchen... 1/2 tab by mouth once daily  Orders: TLB-Lipid Panel (80061-LIPID) Venipuncture (78469) TLB-BMP (Basic Metabolic Panel-BMET) (80048-METABOL) TLB-CBC Platelet - w/Differential (85025-CBCD) TLB-Hepatic/Liver Function Pnl (80076-HEPATIC) TLB-TSH  (Thyroid Stimulating Hormone) (84443-TSH)  Problem # 3:  ATRIAL FIBRILLATION, CHRONIC (ICD-427.31) Assessment: Unchanged  His updated medication list for this problem includes:    Metoprolol Succinate 50 Mg Tb24 (Metoprolol succinate) .Marland Kitchen... 1/2 tab two times a day    Warfarin Sodium 3 Mg Tabs (Warfarin sodium) ..... Use as directed by anticoagualtion clinic  Problem # 4:  ACNE, ROSACEA (ICD-695.3) Assessment: Improved  Orders: TLB-Lipid Panel (80061-LIPID) Venipuncture (62952) TLB-BMP (Basic Metabolic Panel-BMET) (80048-METABOL) TLB-CBC Platelet - w/Differential (85025-CBCD) TLB-Hepatic/Liver Function Pnl (80076-HEPATIC) TLB-TSH (Thyroid Stimulating Hormone) (84443-TSH)  Complete Medication List: 1)  K-phos-neutral [phone] Mg Tabs (K phos di & mono-sod phos mono) .Marland Kitchen.. 1 tab by mouth once daily as needed 2)  Metrogel 1 % Gel (Metronidazole) .... Uad 3)  Simvastatin 10 Mg Tabs (Simvastatin) .... Take 1 tab by mouth at bedtime 4)  Multivitamins Tabs (Multiple vitamin) .Marland Kitchen.. 1 tab by mouth once daily 5)  Metoprolol Succinate 50 Mg Tb24 (Metoprolol succinate) .... 1/2 tab two times a day 6)  Viagra 100 Mg Tabs (Sildenafil citrate) .... Uad 7)  Vitamin A & D 5000-400 Unit Caps (Vitamins a & d) .... Take on tab once daily 8)  Warfarin Sodium 3 Mg Tabs (Warfarin sodium) .... Use as directed by anticoagualtion clinic 9)  Cinnamon 500 Mg Caps (Cinnamon) .... Take one tab by mouth qd 10)  Turmeric Curcumin Caps (Misc natural products) .... Take one tab by mouth qd 11)  Vitamin D 5,000 International Units  .... Take one tab by mouth once daily 12)  Vitamin E 400 Unit Caps (Vitamin e) .... Take one by mouth qd 13)  Saw Palmetto 450 Mg Caps (Saw palmetto (serenoa repens)) .Marland Kitchen.. 1 by mouth daily 14)  Super Oat Bran 1000 Mg Tabs (Misc natural products) .... 850 mg dose daily 15)  M2 Manganese 4 Mg Tabs (  Misc natural products) .... 2 mg by mouth daily 16)  Niacin Cr 1000 Mg Cr-tabs (Niacin) ....  1/2 tab by mouth once daily 17)  Gamma-linc 500 500 Mg Caps (Black currant seed oil) .... 535 mg daily 18)  Ginkgo Biloba 120 Mg Caps (Ginkgo biloba) .Marland Kitchen.. 120 mg daily (sometimes) 19)  Omega-3 350 Mg Caps (Omega-3 fatty acids) .... Once daily 20)  Zinc 100 Mg Tabs (Zinc) .... Once daily  Other Orders: Gastroenterology Referral (GI)  Patient Instructions: 1)  Please schedule a follow-up appointment in 1 year. 2)  Schedule a colonoscopy/sigmoidoscopy to help detect colon cancer..........Marland Kitchenwe will schedule that for you Prescriptions: VIAGRA 100 MG  TABS (SILDENAFIL CITRATE) UAD  #6 x 11   Entered and Authorized by:   Roderick Pee MD   Signed by:   Roderick Pee MD on 03/06/2009   Method used:   Print then Give to Patient   RxID:   [id number] SIMVASTATIN 10 MG  TABS (SIMVASTATIN) Take 1 tab by mouth at bedtime  #100 x 3   Entered and Authorized by:   Roderick Pee MD   Signed by:   Roderick Pee MD on 03/06/2009   Method used:   Print then Give to Patient   RxID:   [id number] METROGEL 1 %  GEL (METRONIDAZOLE) uad  #PP x 11   Entered and Authorized by:   Roderick Pee MD   Signed by:   Roderick Pee MD on 03/06/2009   Method used:   Print then Give to Patient   RxID:   [id number]

## 2010-02-26 NOTE — Medication Information (Signed)
Summary: rov/tm  Anticoagulant Therapy  Managed by: Bethena Midget, RN, BSN Referring MD: Charlton Haws MD PCP: Kelle Darting, MD Supervising MD: Gala Romney MD, Reuel Boom Indication 1: Atrial Fibrillation (ICD-427.31) Lab Used: [facility] Site: [facility] INR POC 2.3 INR RANGE 2 - 3  Dietary changes: no    Health status changes: no    Bleeding/hemorrhagic complications: no    Recent/future hospitalizations: no    Any changes in medication regimen? no    Recent/future dental: no  Any missed doses?: no       Is patient compliant with meds? yes       Allergies: No Known Drug Allergies  Anticoagulation Management History:      The patient is taking warfarin and comes in today for a routine follow up visit.  Positive risk factors for bleeding include an age of 80 years or older.  The bleeding index is 'intermediate risk'.  Positive CHADS2 values include History of HTN.  Negative CHADS2 values include Age > 22 years old.  The start date was 02/16/2003.  Anticoagulation responsible provider: Bensimhon MD, Reuel Boom.  INR POC: 2.3.  Cuvette Lot#: [id number].  Exp: 02/2011.    Anticoagulation Management Assessment/Plan:      The patient's current anticoagulation dose is Warfarin sodium 3 mg tabs: Use as directed by Anticoagulation Clinic.  The target INR is 2 - 3.  The next INR is due 03/03/2010.  Anticoagulation instructions were given to patient.  Results were reviewed/authorized by Bethena Midget, RN, BSN.  He was notified by Bethena Midget, RN, BSN.         Prior Anticoagulation Instructions: INR 2.2 Continue 4.5mg s daily. Recheck in 4 weeks.   Current Anticoagulation Instructions: INR 2.3 Continue 4.5mg s daily. Recheck in 4 weeks.

## 2010-03-03 ENCOUNTER — Encounter (INDEPENDENT_AMBULATORY_CARE_PROVIDER_SITE_OTHER): Payer: Medicare Other

## 2010-03-03 ENCOUNTER — Encounter: Payer: Self-pay | Admitting: Cardiovascular Disease

## 2010-03-03 DIAGNOSIS — Z7901 Long term (current) use of anticoagulants: Secondary | ICD-10-CM

## 2010-03-03 DIAGNOSIS — I4891 Unspecified atrial fibrillation: Secondary | ICD-10-CM

## 2010-03-03 LAB — CONVERTED CEMR LAB: POC INR: 2.6

## 2010-03-10 DIAGNOSIS — I4891 Unspecified atrial fibrillation: Secondary | ICD-10-CM

## 2010-03-12 NOTE — Medication Information (Signed)
Summary: Coumadin Clinic  Anticoagulant Therapy  Managed by: Bethena Midget, RN, BSN Referring MD: Charlton Haws MD PCP: Kelle Darting, MD Supervising MD: Eden Emms MD, Theron Arista Indication 1: Atrial Fibrillation (ICD-427.31) Lab Used: [facility] Site: [facility] INR POC 2.6 INR RANGE 2 - 3  Dietary changes: no    Health status changes: no    Bleeding/hemorrhagic complications: no    Recent/future hospitalizations: no    Any changes in medication regimen? no    Recent/future dental: no  Any missed doses?: no       Is patient compliant with meds? yes      Comments: On 05/04/10 pending  cataract removal then approx 2-3 weeks after that he will have the other one done.  Allergies: No Known Drug Allergies  Anticoagulation Management History:      The patient is taking warfarin and comes in today for a routine follow up visit.  Positive risk factors for bleeding include an age of 37 years or older.  The bleeding index is 'intermediate risk'.  Positive CHADS2 values include History of HTN.  Negative CHADS2 values include Age > 59 years old.  The start date was 02/16/2003.  Anticoagulation responsible provider: Eden Emms MD, Theron Arista.  INR POC: 2.6.  Cuvette Lot#: [id number].  Exp: 02/2011.    Anticoagulation Management Assessment/Plan:      The patient's current anticoagulation dose is Warfarin sodium 3 mg tabs: Use as directed by Anticoagulation Clinic.  The target INR is 2 - 3.  The next INR is due 03/31/2010.  Anticoagulation instructions were given to patient.  Results were reviewed/authorized by Bethena Midget, RN, BSN.  He was notified by Bethena Midget, RN, BSN.         Prior Anticoagulation Instructions: INR 2.3 Continue 4.5mg s daily. Recheck in 4 weeks.   Current Anticoagulation Instructions: INR 2.6 Continue 4.5mg s everyday. Recheck in 4 weeks.

## 2010-03-24 ENCOUNTER — Ambulatory Visit (INDEPENDENT_AMBULATORY_CARE_PROVIDER_SITE_OTHER): Payer: Medicare Other | Admitting: Family Medicine

## 2010-03-24 ENCOUNTER — Encounter: Payer: Self-pay | Admitting: Family Medicine

## 2010-03-24 DIAGNOSIS — I4891 Unspecified atrial fibrillation: Secondary | ICD-10-CM

## 2010-03-24 DIAGNOSIS — E785 Hyperlipidemia, unspecified: Secondary | ICD-10-CM

## 2010-03-24 DIAGNOSIS — I10 Essential (primary) hypertension: Secondary | ICD-10-CM

## 2010-03-24 DIAGNOSIS — E782 Mixed hyperlipidemia: Secondary | ICD-10-CM

## 2010-03-24 DIAGNOSIS — L719 Rosacea, unspecified: Secondary | ICD-10-CM

## 2010-03-24 DIAGNOSIS — R413 Other amnesia: Secondary | ICD-10-CM

## 2010-03-24 DIAGNOSIS — M109 Gout, unspecified: Secondary | ICD-10-CM

## 2010-03-24 MED ORDER — METRONIDAZOLE 1 % EX GEL: Freq: Every day | CUTANEOUS | Status: DC

## 2010-03-24 MED ORDER — ALLOPURINOL 300 MG PO TABS: 300.0000 mg | ORAL_TABLET | Freq: Every day | ORAL | Status: DC

## 2010-03-24 MED ORDER — SIMVASTATIN 10 MG PO TABS: 10.0000 mg | ORAL_TABLET | Freq: Every day | ORAL | Status: DC

## 2010-03-24 MED ORDER — METOPROLOL SUCCINATE ER 50 MG PO TB24: 50.0000 mg | ORAL_TABLET | Freq: Every day | ORAL | Status: DC

## 2010-03-24 NOTE — Progress Notes (Signed)
  Subjective:    Patient ID: Chad Tucker, male    DOB: Jun 14, 1933, 77 y.o.   MRN: [id number]  HPIwilliam Is a 77 year old, married male, nonsmoker retired [employer] consultant who comes in today for general physical exam because of a history of gout.  Hypertension rosacea hyperlipidemia.  He takes 300 mg of allopurinol daily to prevent gout.  He takes a beta-blocker 25 mg b.i.d. For hypertension.  BP 110/70, pulse 70 and irregular, because of the underlying history of a two fibrillation.  He is on Coumadin and is followed at the cardiology clinic.  He uses MetroGel nightly for rosacea.  He takes simvastatin 10 nightly for hyperlipidemia.  Will check lipid panel today.  He takes about 30 vitamins and supplements  He sees cardiology every 6 months also, his urologist yearly.      Review of Systems  Constitutional: Negative.   HENT: Negative.   Eyes: Negative.   Respiratory: Negative.   Cardiovascular: Negative.   Gastrointestinal: Negative.   Genitourinary: Negative.   Musculoskeletal: Negative.   Skin: Negative.   Neurological: Negative.   Hematological: Negative.   Psychiatric/Behavioral: Negative.        Objective:   Physical Exam  Constitutional: He is oriented to person, place, and time. He appears well-developed and well-nourished.  HENT:  Head: Normocephalic and atraumatic.  Right Ear: External ear normal.  Left Ear: External ear normal.  Nose: Nose normal.  Mouth/Throat: Oropharynx is clear and moist.  Eyes: Conjunctivae and EOM are normal. Pupils are equal, round, and reactive to light.       Bilateral cataracts, due for cataract surgery, the spring  Neck: Normal range of motion. Neck supple. No JVD present. No tracheal deviation present. No thyromegaly present.  Cardiovascular: Normal rate, normal heart sounds and intact distal pulses.  Exam reveals no gallop and no friction rub.   No murmur heard.      Irregularly irregular, consistent with atrial fib, controlled rate  of around 70  Pulmonary/Chest: Effort normal and breath sounds normal. No stridor. No respiratory distress. He has no wheezes. He has no rales. He exhibits no tenderness.  Abdominal: Soft. Bowel sounds are normal. He exhibits no distension and no mass. There is no tenderness. There is no rebound and no guarding.  Genitourinary: Rectum normal.  Musculoskeletal: Normal range of motion. He exhibits no edema and no tenderness.  Lymphadenopathy:    He has no cervical adenopathy.  Neurological: He is alert and oriented to person, place, and time. He has normal reflexes. No cranial nerve deficit. He exhibits normal muscle tone.  Skin: Skin is warm and dry. No rash noted. No erythema. No pallor.  Psychiatric: He has a normal mood and affect. His behavior is normal. Judgment and thought content normal.          Assessment & Plan:  History of gout,,,,,,,,, continue allopurinol daily.  History of hypertension.  Continue beta-blocker 25 b.i.d.  History of rosacea continue MetroGel p.r.n.  History of hyperlipidemia.  Continue simvastatin 10 nightly  Atrial fibrillation.  Continue Coumadin and follow-up in cardiology.  Short-term memory loss.  Stop all the supplements.  Neurologic consult with Dr. Porfirio Mylar D.

## 2010-03-24 NOTE — Patient Instructions (Signed)
Continue your essential medications.  Stop all the supplements.  I put in a request for a consult with Dr. Percell Miller. [place].  Neurologic  Return one year or sooner if any problems.  I will call you your lab reports

## 2010-03-25 DIAGNOSIS — E782 Mixed hyperlipidemia: Secondary | ICD-10-CM

## 2010-03-25 DIAGNOSIS — R413 Other amnesia: Secondary | ICD-10-CM

## 2010-03-25 DIAGNOSIS — I10 Essential (primary) hypertension: Secondary | ICD-10-CM

## 2010-03-25 DIAGNOSIS — M109 Gout, unspecified: Secondary | ICD-10-CM

## 2010-03-25 DIAGNOSIS — I4891 Unspecified atrial fibrillation: Secondary | ICD-10-CM

## 2010-03-25 LAB — LIPID PANEL
LDL Cholesterol: 98 mg/dL (ref 0–99)
Total CHOL/HDL Ratio: 3
VLDL: 17.6 mg/dL (ref 0.0–40.0)

## 2010-03-25 LAB — BASIC METABOLIC PANEL
BUN: 14 mg/dL (ref 6–23)
Calcium: 8.6 mg/dL (ref 8.4–10.5)
GFR: 90.89 mL/min (ref >60.00)
Glucose, Bld: 88 mg/dL (ref 70–99)
Potassium: 4 mEq/L (ref 3.5–5.1)
Sodium: 144 mEq/L (ref 135–145)

## 2010-03-25 LAB — POCT URINALYSIS DIPSTICK
Leukocytes, UA: NEGATIVE
Nitrite, UA: NEGATIVE
Urobilinogen, UA: 0.2

## 2010-03-25 LAB — HEPATIC FUNCTION PANEL
ALT: 22 U/L (ref 0–53)
AST: 32 U/L (ref 0–37)
Alkaline Phosphatase: 60 U/L (ref 39–117)
Total Bilirubin: 0.8 mg/dL (ref 0.3–1.2)

## 2010-03-25 LAB — CBC WITH DIFFERENTIAL/PLATELET
Basophils Absolute: 0 10*3/uL (ref 0.0–0.1)
Eosinophils Relative: 2.5 % (ref 0.0–5.0)
Hemoglobin: 14.3 g/dL (ref 13.0–17.0)
Lymphocytes Relative: 28.5 % (ref 12.0–46.0)
Monocytes Relative: 11.2 % (ref 3.0–12.0)
Platelets: 132 10*3/uL — ABNORMAL LOW (ref 150.0–400.0)
RDW: 14.9 % — ABNORMAL HIGH (ref 11.5–14.6)
WBC: 3.9 10*3/uL — ABNORMAL LOW (ref 4.5–10.5)

## 2010-03-25 LAB — T4, FREE: Free T4: 0.92 ng/dL (ref 0.60–1.60)

## 2010-03-26 NOTE — Progress Notes (Signed)
patient  Is aware 

## 2010-03-27 ENCOUNTER — Ambulatory Visit (INDEPENDENT_AMBULATORY_CARE_PROVIDER_SITE_OTHER): Payer: Medicare Other | Admitting: Family Medicine

## 2010-03-27 ENCOUNTER — Encounter: Payer: Self-pay | Admitting: Family Medicine

## 2010-03-27 VITALS — BP 108/80 | Temp 98.2°F | Wt 241.5 lb

## 2010-03-27 DIAGNOSIS — E538 Deficiency of other specified B group vitamins: Secondary | ICD-10-CM

## 2010-03-27 DIAGNOSIS — D510 Vitamin B12 deficiency anemia due to intrinsic factor deficiency: Secondary | ICD-10-CM

## 2010-03-27 MED ORDER — SYRINGE (DISPOSABLE) 3 ML MISC: 1.0000 mL | Status: DC

## 2010-03-27 MED ORDER — CYANOCOBALAMIN 1000 MCG/ML IJ SOLN: 1000.0000 ug | Freq: Once | INTRAMUSCULAR | Status: AC

## 2010-03-27 NOTE — Patient Instructions (Signed)
1 cc weekly x 8 weeks, then 1 cc monthly forever.  Stop the anticholinergics.  Follow-up with neurology as outlined

## 2010-03-27 NOTE — Progress Notes (Signed)
  Subjective:    Patient ID: Chad Tucker, male    DOB: [birth year], 77 y.o.   MRN: [id number]  HPIWilliam is a 77 year old, married male, nonsmoker, who comes in today for evaluation of decreased cognitive function.  We saw him earlier in the week with his concern about decreased cognitive function.  Examination was negative.  Lab data shows a markedly low B12 level 199.  The rest of his Lab work  looked normal, including thyroid, et Karie Soda.  He has stopped the plethora of OTC medications, except he still taking an anticholinergic,,,,,,,,,,,, is again advised to stop the anticholinergic.  He was given an article about the problems with medication and mental function.  He continues to take the saw [place] daily, and Prilosec p.r.n.    Review of Systems    Negative Objective:   Physical Exam    Well-developed well-nourished, male in no acute distress.    Assessment & Plan:  Vitamin B12 deficiency,,,,,,,,,,, we discussed how to give himself shots and he gave himself his first 1 cc of vitamin B12, left leg without complications.  Plan 1 cc weekly x 8 weeks, then 1 cc monthly forever.  Stop anti-cholinergics

## 2010-04-07 ENCOUNTER — Encounter (INDEPENDENT_AMBULATORY_CARE_PROVIDER_SITE_OTHER): Payer: Medicare Other

## 2010-04-07 ENCOUNTER — Telehealth: Payer: Self-pay | Admitting: Family Medicine

## 2010-04-07 ENCOUNTER — Encounter: Payer: Self-pay | Admitting: Cardiovascular Disease

## 2010-04-07 DIAGNOSIS — I4891 Unspecified atrial fibrillation: Secondary | ICD-10-CM

## 2010-04-07 DIAGNOSIS — L719 Rosacea, unspecified: Secondary | ICD-10-CM

## 2010-04-07 DIAGNOSIS — Z7901 Long term (current) use of anticoagulants: Secondary | ICD-10-CM

## 2010-04-07 MED ORDER — METRONIDAZOLE 1 % EX GEL: Freq: Every day | CUTANEOUS | Status: DC

## 2010-04-07 NOTE — Telephone Encounter (Signed)
Pt stated that he should have received 3 packages of Metrogel Gel 1% and only received one (1) - pt request 2 more packages.

## 2010-04-14 LAB — CBC
HCT: 42.4 % (ref 39.0–52.0)
MCHC: 33.6 g/dL (ref 30.0–36.0)
MCV: 93.6 fL (ref 78.0–100.0)
Platelets: 150 10*3/uL (ref 150–400)
RDW: 14.6 % (ref 11.5–15.5)

## 2010-04-14 LAB — POCT I-STAT, CHEM 8
Calcium, Ion: 1.11 mmol/L — ABNORMAL LOW (ref 1.12–1.32)
Chloride: 103 mEq/L (ref 96–112)
Glucose, Bld: 88 mg/dL (ref 70–99)
HCT: 45 % (ref 39.0–52.0)
Hemoglobin: 15.3 g/dL (ref 13.0–17.0)
TCO2: 28 mmol/L (ref 0–100)

## 2010-04-14 LAB — DIFFERENTIAL
Basophils Absolute: 0 10*3/uL (ref 0.0–0.1)
Basophils Relative: 1 % (ref 0–1)
Eosinophils Absolute: 0.1 10*3/uL (ref 0.0–0.7)
Eosinophils Relative: 2 % (ref 0–5)
Neutrophils Relative %: 61 % (ref 43–77)

## 2010-04-14 LAB — URINE MICROSCOPIC-ADD ON

## 2010-04-14 LAB — URINE CULTURE: Colony Count: NO GROWTH

## 2010-04-14 LAB — URINALYSIS, ROUTINE W REFLEX MICROSCOPIC
Glucose, UA: NEGATIVE mg/dL
Specific Gravity, Urine: 1.016 (ref 1.005–1.030)

## 2010-04-14 LAB — PROTIME-INR: Prothrombin Time: 31.1 seconds — ABNORMAL HIGH (ref 11.6–15.2)

## 2010-04-14 NOTE — Medication Information (Signed)
Summary: rov/tm  Anticoagulant Therapy  Managed by: Cloyde Reams, RN, BSN Referring MD: Charlton Haws MD PCP: Kelle Darting, MD Supervising MD: Clifton James MD, Cristal Deer Indication 1: Atrial Fibrillation (ICD-427.31) Lab Used: [facility] Site: [facility] INR POC 2.9 INR RANGE 2 - 3  Dietary changes: no    Health status changes: no    Bleeding/hemorrhagic complications: yes       Details: occ nose bleed, not new  Recent/future hospitalizations: no    Any changes in medication regimen? no    Recent/future dental: no  Any missed doses?: no       Is patient compliant with meds? yes       Allergies: No Known Drug Allergies  Anticoagulation Management History:      The patient is taking warfarin and comes in today for a routine follow up visit.  Positive risk factors for bleeding include an age of 75 years or older.  The bleeding index is 'intermediate risk'.  Positive CHADS2 values include History of HTN and Age > 67 years old.  The start date was 02/16/2003.  Anticoagulation responsible provider: Clifton James MD, Cristal Deer.  INR POC: 2.9.  Cuvette Lot#: [id number].  Exp: 03/2011.    Anticoagulation Management Assessment/Plan:      The patient's current anticoagulation dose is Warfarin sodium 3 mg tabs: Use as directed by Anticoagulation Clinic.  The target INR is 2 - 3.  The next INR is due 05/05/2010.  Anticoagulation instructions were given to patient.  Results were reviewed/authorized by Cloyde Reams, RN, BSN.  He was notified by Cloyde Reams RN.         Prior Anticoagulation Instructions: INR 2.6 Continue 4.5mg s everyday. Recheck in 4 weeks.   Current Anticoagulation Instructions: INR 2.9  Continue on same dosage 4.5mg  daily.  Recheck in 4 weeks.

## 2010-05-05 ENCOUNTER — Ambulatory Visit (INDEPENDENT_AMBULATORY_CARE_PROVIDER_SITE_OTHER): Payer: Medicare Other | Admitting: *Deleted

## 2010-05-05 DIAGNOSIS — Z7901 Long term (current) use of anticoagulants: Secondary | ICD-10-CM | POA: Insufficient documentation

## 2010-05-05 DIAGNOSIS — I4891 Unspecified atrial fibrillation: Secondary | ICD-10-CM

## 2010-06-02 ENCOUNTER — Ambulatory Visit (INDEPENDENT_AMBULATORY_CARE_PROVIDER_SITE_OTHER): Payer: Medicare Other | Admitting: *Deleted

## 2010-06-02 DIAGNOSIS — I4891 Unspecified atrial fibrillation: Secondary | ICD-10-CM

## 2010-06-09 NOTE — Assessment & Plan Note (Signed)
[facility]                            CARDIOLOGY OFFICE NOTE   Amrit, Cress LAMIR RACCA                      MRN:          [id number]  DATE:12/13/2006                            DOB:          [birth year]    Chad Tucker returns today for followup.  He has chronic atrial fibrillation  with failed cardioversions on rate control and anticoagulation.   The patient is doing well.  His atrial fibrillation is asymptomatic.  His Coumadin seems to have settled out.  He had a myriad of questions  regarding his previous supplements.   Fortunately for me, Shelby Dubin spent quite some time with him going  over these and their interactions.   In particular, she talked to him about simvastatin in a relationship  with red yeast and niacin increasing the risks of myalgias.  She also  cautioned him against the use of St. John's wort and bleeding  complications as well as his gingko.   Fidencio appropriately was very pleased with [facility] phone consultation.   In regards to his heart, he is otherwise doing well.  He has been  compliant with his meds.  He has not had any significant PND or  orthopnea.  There has been no palpitations, presyncope, or chest pain.   He has been compliant with his meds.  He is seen in the Coumadin clinic.  His review of systems, otherwise, negative.  He is on K-Phos and  Metrogel for his face.  Simvastatin 10 a day, Coumadin as directed, and  Lopressor b.i.d.   His exam is remarkable for an elderly white male in no distress.  Affect  is appropriate.  Weight is 248, pulse is 88 and irregular.  Blood pressure is 130/78,  respiratory rate 14, afebrile.  HEENT:  Unremarkable.  Carotids normal without bruit, no lymphadenopathy or thyromegaly or JVP  elevation.  LUNGS:  Clear with good diaphragmatic motion, no wheezing.  S1, S2 with a soft systolic murmur.  PMI normal.  ABDOMEN:  Benign, bowel sounds positive, no tenderness, no bruit, no  hepatosplenomegaly or hepatojugular reflux.  Distal pulses are intact with no edema.  He does have a bit of a rash on his face which is somewhat hard to  characterize but there is erythema, this is chronic.   His EKG shows atrial fibrillation with occasional premature ventricular  contractions.  Nonspecific ST-T wave changes.   IMPRESSION:  1. Chronic atrial fibrillation, good rate control, anticoagulation.      Follow up in the Coumadin clinic.  Continue beta blocker.  2. Hypoglycemic, continue the simvastatin 10 mg a day.  Lipid and      liver profile in 6 months.  3. Rash, continue Metrogel for face.  4. Dietary supplements, continue as advised by Shelby Dubin in the      Coumadin clinic.  In particular, avoid gingko and red yeast.   The patient will see me back in 6 months.     Noralyn Pick. Eden Emms, MD, [facility]  Electronically Signed    PCN/MedQ  DD: 12/13/2006  DT: 12/14/2006  Job #: [id number]

## 2010-06-09 NOTE — Assessment & Plan Note (Signed)
[facility]                            CARDIOLOGY OFFICE NOTE   Chad Tucker, Chad Tucker                      MRN:          [id number]  DATE:06/06/2006                            DOB:          26-Oct-1933    HISTORY OF PRESENT ILLNESS:  Chad Tucker returns today for follow up.  He  has chronic atrial fibrillation.  He has failed cardioversion.  He  initially did not want to be on Coumadin.  He had a relative who had  complications from it.  However, he has been compliant.  His last three  INR's have been therapeutic, despite therapeutic INR's.  He tends to  bleed a lot when he cuts himself and has had some bloody noses.  He had  approximately 8 months ago a couple of red stools.  He had a  colonoscopy two years ago without any findings.  In regards to his  atrial fibrillation, he has not had any significant palpitations, PND,  orthopnea.  There has been no exertional dyspnea, no chest pain.  He  really has never noticed his irregular heart beats.   From a cardiac standpoint, he has had previous coronary artery disease.  His EF is in the 50% range.  He has had previous stents to the right  coronary artery and a 70% diagonal lesion.   [name] is otherwise unremarkable.  He had a  fairly lengthy consultation at the end of 2007 for his multiple herbal  medications that he takes, in particular, we had advised him not to take  Gingko as it will increase spontaneous bleeding.   We also told him not to take Yohimbine since it acts as a vasodilator.   REVIEW OF SYSTEMS:  Otherwise, negative.   MEDICATIONS:  1. Lopressor 25 mg b.i.d.  2. K-Phos.  3. Metrogel.  4. For cirrhosis on the face, Simvastatin 10 mg daily.  5. Coumadin as directed.  6. He also takes many herbal supplements.   LABORATORY DATA:  Last LDL cholesterol was 122.  This needs to be  rechecked.  We have tried him on higher dose Statin drugs, and he had  myalgias.   PHYSICAL EXAMINATION:  GENERAL:  A middle aged white male in no  distress.  VITAL SIGNS:  Weight is 240 which is stable from previous, blood  pressure 120/86, pulse 65 and irregular, respiratory rate 14.  He is  afebrile.  HEENT:  Unremarkable.  NECK:  Supple.  No thyromegaly.  No JVP elevation.  No carotid bruits.  LUNGS:  Clear to auscultation with no wheezing and normal diaphragmatic  motion.  CARDIAC:  Normal with an S1, S2.  No murmurs, rubs or gallops.  PMI is  not palpable.  ABDOMEN:  Benign.  Bowel sounds are positive.  There is no tenderness.  There is no AAA or renal bruits.  No hepatosplenomegaly.  No  hepatojugular reflux.  Distal pulses are intact.  Femorals are +3  bilaterally with no bruits.  PT's are palpable bilaterally.  There is no  lower extremity edema or varicosities.  No lymphadenopathy.  NEUROLOGICAL:  Nonfocal.  MUSCULOSKELETAL:  No weakness.   IMPRESSION:  Chronic atrial fibrillation with good rate control on  current dose of Lopressor.  Continue to follow up in the Coumadin clinic  for INR.  The patient is somewhat easy to bruise but not having any  serious bleeding.  We tried to get him into the Rely trial, but he was  ineligible.  The patient will have a follow up lipid and liver profile  in six months, I suspect we may try to add Zetia or increase his dose of  Simvastatin.   He is otherwise, active and doing well.  He does not want to pursue  antiarrhythmics or another Cardioversion, which I think is fine since he  is essentially asymptomatic with good rate control of his atrial  fibrillation.   He will have a follow up Myoview and echo to reassess coronary disease  and ejection fraction in a year.     Chad Pick. Eden Emms, MD, [facility]     PCN/MedQ  DD: 06/06/2006  DT: 06/06/2006  Job #: [id number]

## 2010-06-09 NOTE — Assessment & Plan Note (Signed)
[facility]                            CARDIOLOGY OFFICE NOTE   AKAI, DOLLARD                      MRN:          [id number]  DATE:01/11/2008                            DOB:          [birth year]    Mr. Kitzmiller returns today for followup.  He has chronic atrial  fibrillation.  He has been doing well.  He does not have any significant  chest pain, palpitations, PND, or orthopnea.  He does have mild  exertional dyspnea which is likely related to deconditioning and being  overweight.  His coronary risk factors otherwise include  hypercholesterolemia.   The patient has no documented coronary disease.  He has an EF that is in  the 50-55% range by echo.   His review of systems is remarkable for no bleeding diathesis, no chest  pain.  He does have a bit of rosacea on his face which he continues to  use MetroGel for and see a dermatologist.   He has no known allergies.   He is on Phoslo, MetroGel, simvastatin 10 a day, Coumadin as directed,  atenolol 50 a day, and terbinafine 250 a day.   PHYSICAL EXAMINATION:  GENERAL:  Remarkable for an overweight male in  no distress.  VITAL SIGNS:  His weight is 248, blood pressure 118/86, AFib at a rate  of 70.  HEENT:  Unremarkable.  NECK:  Carotid normal without bruit.  No lymphadenopathy, thyromegaly,  or JVP elevation.  LUNGS:  Clear.  Good diaphragmatic motion.  No  wheezing.  HEART:  S1 and S2 with normal heart sounds.  PMI normal.  ABDOMEN:  Benign.  Bowel sounds positive.  No AAA.  No tenderness.  No  bruit.  No hepatosplenomegaly, hepatojugular reflux, or tenderness.  EXTREMITIES:  Distal pulses are intact.  No edema.  NEUROLOGIC:  Nonfocal.  SKIN:  Warm and dry.  MUSCULOSKELETAL:  No muscular weakness.   EKG shows AFib and nonspecific ST-T wave changes.   IMPRESSION:  1. Chronic atrial fibrillation, good rate control with atenolol.      Followup in the Coumadin Clinic today.  2.  Anticoagulation with Coumadin.  No bleeding diathesis.  INRs have      been therapeutic.  3. Hypercholesterolemia.  Continue simvastatin.  Lipid and liver      profile in 6 months.  4. Rosacea.  Continue MetroGel.  Followup with dermatologist.   Overall, I think Biruk is doing well, and I will see him back in 6  months.     Noralyn Pick. Eden Emms, MD, [facility]  Electronically Signed    PCN/MedQ  DD: 01/11/2008  DT: 01/12/2008  Job #: [id number]

## 2010-06-09 NOTE — Assessment & Plan Note (Signed)
[facility]                            CARDIOLOGY OFFICE NOTE   [name], [name]                      MRN:          [id number]  DATE:05/26/2007                            DOB:          07-08-1933    Ms. Chad Tucker returns today for followup of hypertension, atrial  fibrillation, hypercholesterolemia.  He does not have coronary artery  disease.  He has failed cardioversions x3.  He is resolved to that fact  that he will be anticoagulation rate control.  These are going well.  We  had to talk about some supplements in the past particularly red yeast  and gingko and that I do not want him to take them in regards to  Coumadin reactions.  He was asking me if he could come off his  simvastatin.  His LDL tends to run about 120 on 10 of simvastatin.  I  told him if anything I would be tempted to increase it.  He does not  want to do this due to risk of liver toxicity and myalgias.  His last  lipid/liver profile were done this past year by Dr. Tawanna Cooler.  I will have  to get the results.  Otherwise he is doing well.  He is not having  palpitations, PND, orthopnea.  There are no bleeding complications.   PHYSICAL EXAMINATION:  VITAL SIGNS:  Exam is remarkable for being in  atrial fibrillation with a rate of 80.  He has a weight of 237, blood  pressure 110/78, respiratory rate 14, afebrile.  HEENT:  Unremarkable.  NECK:  Carotids normal without bruit, no lymphadenopathy or thyromegaly  or JVP elevation.  LUNGS:  Clear with good diaphragmatic motion.  No wheezing.  HEART:  S1-S2 with normal heart sounds, PMI normal.  ABDOMEN:  Benign.  Bowel sounds positive.  No AAA and no tenderness.  No  hepatosplenomegaly or hepatojugular reflux.  Distal pulses are intact,  no edema.  NEUROLOGICAL:  Nonfocal.  SKIN:  Warm and dry.  No muscular weakness.  He has a bit of a rash on  his face.   IMPRESSION:  1. Atrial fibrillation good rate control.  Continue  anticoagulation,      atenolol 50 a day, controlling rate well.  2. Hypercholesterolemia.  Continue simvastatin.  The patient hesitant      to increase it.  I will get his labs from Dr. Tawanna Cooler.  I reassured      him in regards to liver abnormalities that they are reversible and      he is having them checked.  3. Rash on face.  Continue MetroGel.  Follow up with dermatologist.   Overall I think Chad Tucker is doing well and I will see him in 6 months.   His meds currently include K-Phos 250 a day, MetroGel to the face,  simvastatin 10 a day, Coumadin as directed and atenolol 50 a day.     Noralyn Pick. Eden Emms, MD, [facility]  Electronically Signed    PCN/MedQ  DD: 05/26/2007  DT: 05/26/2007  Job #: [id number]

## 2010-06-12 NOTE — Discharge Summary (Signed)
NAME:  Chad Tucker, Chad Tucker NO.:  [id number]   MEDICAL RECORD NO.:  [id number]                   PATIENT TYPE:  INP   LOCATION:  2022                                 FACILITY:  [facility]   PHYSICIAN:  Charlton Haws, M.D.                  DATE OF BIRTH:  03-04-1933   DATE OF ADMISSION:  02/12/2003  DATE OF DISCHARGE:  02/17/2003                                 DISCHARGE SUMMARY   DISCHARGE DIAGNOSES:  1. New onset atrial fibrillation.  2. Nonischemic cardiomyopathy with an ejection fraction of 30%.  3. Nonobstructive coronary artery disease by catheterization 02/12/03.  4. Dyslipidemia.  5. Nonsustained ventricular tachycardia.  6. Ureteral calculi.  7. Congestive heart failure.  8. Transient post-catheterization intensive care unit confusion - resolved.   PROCEDURES THIS ADMISSION:  Cardiac catheterization by Dr. Charlies Constable on  February 12, 2003 revealing irregular LAD and irregular circumflex, RCA with  30 and 40% mid-stenoses.  The acute marginal had TIMII-II flow. Left  ventriculogram revealed global hypokinesis with EF of 30%.  Pulmonary  capillary wedge pressure was 31.  TIMI   HOSPITAL COURSE:  Please see the admission history and physical on February 11, 2003 for details. Briefly, this 77 year old male was undergoing preop  evaluation by anesthesia for ureteral stone removal.  He was noted to have  an increased heart rate.  He also had been noticing shortness of breath for  sometime.  He was referred to [place] for further evaluation in the Emergency  Room at [facility].  He had mildly positive enzyme with CK-MB of  60.6, 11.6 and 9.0.  His troponin I was 0.12, 0.11, 0.10.  His BNP on  admission was 601.  It was felt the patient needed further cardiac work-up.  He was transferred to [facility] on January 18th for cardiac  catheterization.   Upon arrival, he developed bradycardia with heart rate in the 20-30s and  hypotension with  systolic blood pressure in the 70's.  He was given  Atropine, placed on dopamine. His rate did come up into the 60s and his  pressure came up to 83/67.  He went directly to the catheterization lab.  The patient underwent catheterization by Dr. Juanda Chance as noted above.  It was  felt that he had acute severe nonischemic cardiomyopathy. Dr. Juanda Chance felt  that the etiology was unclear.  Originally, he decided to diurese and  support his blood pressure with dopamine, and consider early cardioversion.  He questioned whether or not this would be a tachycardia mediated  cardiomyopathy.   However, the patient remained hypotensive. Diuresis was postponed given his  low blood pressure.  He was watched in the ICU.  He became confused on the  night of February 13, 2003.  He got up out of his bed, taking out some of his  lines. He was sedated and placed in 4-point restraints  and watched  overnight. The next morning, he was seen by Dr. Juanda Chance.  His confusion and  agitation were improving. He was placed back on heparin and his atrial  fibrillation was treated with Digoxin and amiodarone.  He did have some  nonsustained ventricular tachycardia noted on the monitors, and his  potassium was low at 3.3. This was repleted.   Dr. Eden Emms saw the patient on January 20th and felt that cardioversion could  be postponed at this time.  He felt that an elective cardioversion in  approximately 4 weeks was in order. The patient's pressure was better and he  discontinued his dopamine on January 20th. He also took him off of the  amiodarone.  Dr. Eden Emms changed the patient from heparin to Lovenox on  January 21st.  He felt the patient could be discharged to home if his INR  was over 2.  His heart rate continued to uncontrolled in the 130s and his  Coreg was increased.  The patient was seen again on January 22nd. His INR  was only 1.4 and his rate continued to be difficult to control.  Dr.  Gerri Spore saw the patient and  changed his Coreg to Lopressor.  The patient's  rate was much better on January 23rd and his Lopressor dose was increased.  His INR was therapeutic on January 23rd at 2.2, therefore, it was felt that  the patient was stable enough for discharge to home.   He will need close follow up next week in our Coumadin clinic and with Dr.  Eden Emms or the physician's assistant, to check his rate.  If his rate is  controlled and his blood pressure can handle the addition of another  medication, then an ACE inhibitor should be considered as he does have  cardiomyopathy with an EF of 30%.   LABORATORY DATA:  White count 11,800, hemoglobin 13.5, hematocrit 40.7,  platelet count 580,000, INR 2.2 on February 17, 2003. Sodium 142, potassium  4.1, chloride 107, CO2 29, glucose 92, BUN 10, creatinine 1.2, calcium 8.4,  magnesium 1.9, total bilirubin 1.4, ALT 85, AST 96, total protein 5.1,  albumin 3.1.  Cardiac enzymes and BNP as noted above.  Total cholesterol  138, triglyceride  77, HDL 61, LDL 62.  TSH 2.509, free T4 10.  Chest x-ray:  Cardiomegaly with mild pulmonary vascular prominence on February 11, 2003.   DISCHARGE MEDICATIONS:  1. Aspirin 81 mg daily.  2. Coumadin 5 mg 1-1/2 tablets daily.  3. Lanoxin 0.125 mg daily.  4. Lipitor 10 mg q.h.s.  5. Lopressor 50 mg b.i.d.  6. Nitroglycerin p.r.n. chest pain.  7. K-phos as directed.  8. MetroGel as directed.   The patient has been advised to stop taking his HCTZ.  The patient is also  on multiple herbal supplements at home.  Most of these were checked into  during his stay, and he was advised to stop ginkgo biloba and red yeast  rice.   Pain management was Tylenol as needed, nitroglycerin as needed for chest  pain. He is to call our office or 911 for chest pain.   ACTIVITY:  As tolerated.   DIET:  Low fat, low sodium.   WOUND CARE:  The patient should call for any groin swelling, bleeding or  bruising.  SPECIAL INSTRUCTIONS:  Patient  advised to weigh himself daily and call if he  gains more than 3 pounds in 1 day.   FOLLOW UP:  The patient will see someone in our  Coumadin clinic on Tuesday  February 19, 2003.  The office will contact him with an appointment time.  The patient will also see Dr. Eden Emms or the physician's assistant sometime  mid to late next week to recheck his rate.  If it is well controlled on  Lopressor dose, then we may be able to add an ACE inhibitor.  If his rate  remains uncontrolled, then further adjustment will need to be made in his  beta-blocker.      Tereso Newcomer, P.A.                        Charlton Haws, M.D.    SW/MEDQ  D:  02/17/2003  T:  02/17/2003  Job:  [id number]   cc:   Tinnie Gens A. Tawanna Cooler, M.D. [facility]   Charlton Haws, M.D.

## 2010-06-12 NOTE — Discharge Summary (Signed)
NAME:  Chad Tucker, Chad Tucker NO.:  [id number]   MEDICAL RECORD NO.:  [id number]                   PATIENT TYPE:  INP   LOCATION:  3732                                 FACILITY:  [facility]   PHYSICIAN:  Charlton Haws, M.D.                  DATE OF BIRTH:  23-Jun-1933   DATE OF ADMISSION:  02/22/2003  DATE OF DISCHARGE:  02/24/2003                                 DISCHARGE SUMMARY   HISTORY OF PRESENT ILLNESS:  Chad Tucker is a pleasant, 77 year old, white  male with a history of nonischemic cardiomyopathy with an EF of 30%, mild  nonobstructive coronary artery disease by catheterization on February 04, 2003, and new onset of atrial fibrillation currently on Coumadin therapy,  who presented to the office on Friday, February 22, 2003, as an add on with  worsening lower extremity edema.  He was recently in the hospital from  February 04, 2003, through February 17, 2003, secondary to new onset atrial  fibrillation.  He was also note to have mild positive cardiac enzymes and  elevated BNP and was taken to the catheterization laboratory on February 12, 2003.  His angiogram revealed an RCA with 30-40% mid stenosis, an acute  marginal TIMI-2 flow, and an LVF of 30%.  His pulmonary capillary wedge  pressure was 31.  He was hypotensive and diuresis could not be performed.  He recovered well.  His rate was finally controlled, INR was therapeutic,  and he was discharged on February 17, 2003.  Since that time, he has been  back to the office and had a Coumadin checked that was reportedly 2.8 on  February 19, 2003.  He returned again on February 22, 2003, for his Coumadin  check.  The machine was unable to read this.  He noted some ecchymoses over  his lower extremities and his abdomen.  He also had 3+ lower extremity edema  to his knees and scrotal swelling.  He denied any shortness of breath or  chest pain.  He was admitted from the office.   PAST MEDICAL HISTORY:  1. As noted above,  he has known mild obstructive coronary artery disease by     catheterization on February 04, 2003.  2. Nonischemic cardiomyopathy with an EF of 30%.  3. Hyperlipidemia on treatment with Lipitor.  4. New onset atrial fibrillation.  Coumadin therapy with INRs as noted     above.   ALLERGIES:  No known drug allergies.   SOCIAL HISTORY:  The patient lives in [place], [place], with his  wife.  Denies any tobacco abuse.   FAMILY HISTORY:  Positive for coronary artery disease.   HOSPITAL COURSE:  The patient was admitted on February 22, 2003.  He was  initiated on IV Lasix 40 mg q.12h.  The BNP was initially elevated at 1200  on February 23, 2003.  Mavik was initiated at this time  secondary to his low  EF.  His INR was 3.7.  Coumadin was on hold.  On February 24, 2003,  laboratories revealed an approximate 8-pound weight loss and I&Os were  negative.  The patient was very anxious to be discharged from the hospital.  With discussion with Dr. Andee Lineman, we decided to discharge him on p.o. Lasix  with potassium and continue the Mavik.  Upon questioning, Dr. Andee Lineman felt  that the patient did have several risk factors for sleep apnea, so overnight  pulse oximetry was checked on February 23, 2003, however, these results are  not available to me at this time.  The patient denied any shortness of  breath or chest pain throughout his stay.   LABORATORY DATA:  On the day of discharge, the patient's WBC was 5.1, the  hemoglobin was 13.4, the hematocrit was 39.2, and the platelet count was  195.  The INR was 3.0.  The sodium was 141, potassium 3.4, chloride 103, BUN  13, and creatinine 1.2.  Beta natruretic peptide was 869.   DISCHARGE MEDICATIONS:  The patient was given an additional 20 mEq of  potassium prior to leaving the hospital.  He was initiated on Lasix 40 mg  p.o. once daily and Kay-Ciel 20 mEq once daily.  He was to continue his  previous medications, including aspirin 81 mg one p.o.  daily, Lanoxin 0.125  mg one p.o. daily, Lipitor 20 mg one p.o. q.h.s., and Lopressor 50 mg one  b.i.d.  His Coumadin was kept at 2.5 mg once daily.  He is to use Tylenol  p.r.n. for pain.  Prescriptions were given for Mavik 2 mg one p.o. daily,  Lasix 40 mg daily, and potassium.   FOLLOWUP:  He does have a followup appointment with Dr. Eden Emms either Monday  or Tuesday.  The patient is aware to keep this appointment and have his pro  time and BMET checked at that time.  He is also told to weigh himself daily  and if he gains greater than 2-3 pounds overnight, he is to call Dr. Eden Emms.   DIET:  He is to follow a low-salt diet.   ACTIVITY:  As tolerated.   DISCHARGE DIAGNOSES:  1. Nonischemic cardiomyopathy with 30% ejection fraction.  2. Atrial fibrillation.  3. Long-term anticoagulation.  The INR at discharge was 3.0.  The patient     was placed on 2.5 mg until followup on Monday or Tuesday.  4. Lower extremity volume overload and right heart failure much improved     prior to discharge.  5. Hypertension.  6. Hyperlipidemia.   PRIMARY CARE PHYSICIAN:  Tinnie Gens A. Tawanna Cooler, M.D.      [place], [place]                       Charlton Haws, M.D.    MP/MEDQ  D:  02/24/2003  T:  02/24/2003  Job:  [id number]   cc:   Tinnie Gens A. Tawanna Cooler, M.D. [facility]

## 2010-06-12 NOTE — H&P (Signed)
NAME:  Chad Tucker, NIN NO.:  [id number]   MEDICAL RECORD NO.:  [id number]                   PATIENT TYPE:  INP   LOCATION:  3732                                 FACILITY:  [facility]   PHYSICIAN:  Willa Rough, M.D.                  DATE OF BIRTH:  August 18, 1933   DATE OF ADMISSION:  02/22/2003  DATE OF DISCHARGE:                                HISTORY & PHYSICAL   PRIMARY CARDIOLOGIST:  Charlton Haws, M.D.   PRIMARY CARE PHYSICIAN:  Tinnie Gens A. Tawanna Cooler, M.D.   CHIEF COMPLAINT:  Edema.   HISTORY OF PRESENT ILLNESS:  Mr. Gottschall is a very pleasant 77 year old male  with a history of non-ischemic cardiomyopathy with an EF of 30%, mild non-  obstructive coronary artery disease by catheterization on February 04, 2003,  new onset atrial fibrillation, currently on Coumadin therapy, who presents  to the office today as an add-on with worsening lower extremity edema.  He  was recently in the hospital from January 10 through February 17, 2003,  secondary to new onset atrial fibrillation.  He also was noted to have  mildly positive cardiac enzymes and elevated BNP, and he was taken to the  catheterization lab.  On February 12, 2003, his angiogram revealed a RCA with  30 to 40% mid stenoses and acute marginal TIMI II flow, and a LVF of 30%.  His pulmonary capillary wedge pressure was 31.  He was hypotensive and  diuresis could not be performed.  He recovered well.  His rate was finally  controlled, and his INR was therapeutic, and he was discharged home on  February 17, 2003.  Since that time, he has been back to the office and had  Coumadin checked that was reportedly 2.8 on February 19, 2003.  He returned  today for another Coumadin check and the machine was unable to read this.  He has noted some ecchymoses in his lower extremities, and his abdomen and  his hip.  He is also in the office today complaining of lower extremity  edema that has just been worsening since he was  discharged home.  He called  the office yesterday and was instructed to take his HCTZ which had been held  at discharge.  This provided no relief.  He describes swelling all the way  up his bilateral legs.  He also notes some abdominal fullness.  He has noted  some scrotal swelling as well.  He denies any shortness of breath at rest,  but does have dyspnea on exertion.  He denies any chest pain.  He denies any  orthopnea, or paroxysmal nocturnal dyspnea.  He has had a non-productive  cough for several months now.   PAST MEDICAL HISTORY:  1. As noted above, he has mild known obstructive coronary artery disease by     catheterization on February 04, 2003.  2. Non-ischemic cardiomyopathy with an EF  of 30%.  3. He has treated dyslipidemia.  4. He has new onset atrial fibrillation, in on Coumadin therapy with INRs as     noted above.   ALLERGIES:  No known drug allergies.   MEDICATIONS:  1. Aspirin 81 mg daily.  2. Coumadin.  3. Lanoxin 0.125 mg daily.  4. Lipitor 10 mg q.h.s.  5. Lopressor 50 mg b.i.d.  6. K-Phos.  7. MetroGel.   SOCIAL HISTORY:  The patient lives in [place] and denies any tobacco  abuse.   FAMILY HISTORY:  Positive for coronary artery disease.   REVIEW OF SYSTEMS:  Please see HPI.  He denies any fevers, chills, melena,  hematochezia, hematuria, dysuria, numbness, tingling.  He has had some  epistaxis, but this is minor.  He has had a non-productive cough as noted  above.  He does have dyspnea on exertion, but denies any orthopnea or  paroxysmal nocturnal dyspnea.  The rest of the review of systems are  negative.   PHYSICAL EXAMINATION:  GENERAL:  A well-developed, well-nourished man in no  acute distress.  VITAL SIGNS:  Blood pressure 109/75, pulse 76, weight 250 pounds.  He says  his baseline weight is 235 pounds.  HEENT:  Head is normocephalic, atraumatic.  Eyes:  PERRLA, EOMI.  NECK:  With positive JVD.  LYMPH:  Without lymphadenopathy.  CARDIAC:   Normal S1 and S2.  Irregularly irregular rhythm.  LUNGS:  Clear to auscultation bilaterally.  ABDOMEN:  Soft, nontender, no hepatomegaly noted.  EXTREMITIES:  With 3+ edema all the way up to the leve of his knees  bilaterally.  GENITOURINARY:  He does have positive scrotal edema.  NEUROLOGIC:  He is alert and oriented x3.  Nonfocal.  SKIN:  He does have scattered ecchymoses on his right calf, his right thigh,  and his abdomen.   LABORATORY DATA:  Chest x-ray in the office today reveals mild vascular  congestion.  EKG reveals atrial fibrillation with a ventricular rate of 76,  normal axis, nonspecific ST-T wave changes.   IMPRESSION:  1. Volume overload.  2. Atrial fibrillation with controlled ventricular rate.  3. Coumadin therapy.     a. Presumed supratherapeutic INR with signs of ecchymoses on examination.  4. Treated dyslipidemia.  5. Non-ischemic cardiomyopathy with an ejection fraction of 30%.  6. Non-obstructive coronary artery disease by catheterization on February 12, 2003.   PLAN:  The patient's case was discussed with Dr. Myrtis Ser today.  He agrees with  direct admission from the office to [facility] for further care.  The patient will be given vitamin K 2 mg p.o. upon arrival.  His PT and INR  will be checked daily.  We will also diurese him with 40 mg of IV Lasix  q.12h.  We will check cardiac enzymes and a BNP level.  We will hold his  Coumadin for now and restart it when appropriate.      Tereso Newcomer, P.A.                        Willa Rough, M.D.    SW/MEDQ  D:  02/22/2003  T:  02/23/2003  Job:  [id number]   cc:   Tinnie Gens A. Tawanna Cooler, M.D. [facility]

## 2010-06-12 NOTE — Assessment & Plan Note (Signed)
[facility]                              CARDIOLOGY OFFICE NOTE   PHYLLIS, WHITEFIELD                      MRN:          [id number]  DATE:12/02/2005                            DOB:          Dec 29, 1933    Mr. Roig returns today for followup.  He is status post failed  cardioversion with chronic atrial fibrillation.  He was enrolled in the RE-  LY trial I believe and was randomized to Coumadin.  He has issues with  Coumadin.  His father had problems on it.   He does not want to be on antiarrhythmics and we have settled for rate  control.  He does not have any TIA or CVA-like symptoms.  He seems to have  had a very good time in [facility] recently.  He has not had any significant  palpitations, PND, orthopnea.  He asked me what TIAs would feel like and I  told him that there are many varied presentations, depending on what part of  the brain was involved.   He is on simvastatin and he tells me Dr. Tawanna Cooler checked his liver and lipid  profile recently.  I told him this was important since the anticoagulants  and statins work in the liver.  He assures me that they were checked and  they were fine.  He is taking his Lopressor twice a day but sometimes  forgets his morning dose.   REVIEW OF SYSTEMS:  Otherwise benign.   MEDICATIONS:  1. Lopressor 25 b.i.d.  2. K-Phos 250 a day.  3. Metrogel daily for his face, which he has some psoriasis.  4. Simvastatin 10 a day.  5. Coumadin as directed.   EXAMINATION:  VITAL SIGNS:  He is in atrial fibrillation at a rate of 65.  Blood pressure is 118/86.  SKIN:  He has small rash on his forehead.  HEENT:  Normal.  There is no thyromegaly, there is no lymphadenopathy, there  is no carotid bruits.  LUNGS:  Clear.  HEART:  There is an S1, S2 with normal heart sounds.  ABDOMEN:  Benign.  LOWER EXTREMITIES:  Intact pulses, no edema.  NEUROLOGIC:  Nonfocal.   IMPRESSION:  1. Stable chronic atrial fibrillation,  good rate control, on      anticoagulation; no untoward effects from either.  2. Hyperlipidemia, to be followed by Dr. Tawanna Cooler.   The patient is not having palpitations.  He has no significant coronary  artery disease and low-normal ejection fraction.  I will see him back in 6  months.  He should probably have his LV function checked in a year or two by  echocardiogram.    ______________________________  Noralyn Pick. Eden Emms, MD, [facility]    PCN/MedQ  DD: 12/02/2005  DT: 12/02/2005  Job #: [id number]

## 2010-06-12 NOTE — H&P (Signed)
NAME:  Chad Tucker, Chad Tucker NO.:  [id number]   MEDICAL RECORD NO.:  [id number]                   PATIENT TYPE:  INP   LOCATION:  0108                                 [facility]ACILITY[facility]  [facility]   PHYSICIAN:  Charlton Haws, M.D.                  DATE OF BIRTH:  1933-03-25   DATE OF ADMISSION:  02/11/2003  DATE OF DISCHARGE:                                HISTORY & PHYSICAL   HISTORY OF PRESENT ILLNESS:  Mr. 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 Apparently  he has a rather large ureteral kidney  stone that Dr. Aldean Ast had planned on removing soon. The patient was found  to be in atrial fibrillation and was referred to the [facility]  emergency room.   In talking to the patient he has seen Dr. Tawanna Cooler over the years. He has had  increasing shortness of breath and lower extremity swelling since about  November. He had been treated for some bronchitis and given some prednisone  in the past months. However, he has continued to notice some shortness of  breath. He has not had any significant chest pain. An  EKG from Dr. Nelida Meuse  office in 2001 showed sinus rhythm. The patient notes that he has had  irregularities in the past, but never documented atrial fibrillation.   He denied significant problems with his thyroid. He has not had any  significant chest pain. He has coronary risk factors which include  hyperlipidemia for which he takes Lipitor. His review of systems is  otherwise  remarkable for some epistaxis when he takes aspirin. He is  currently not taking aspirin.   ALLERGIES:  No known drug allergies.   CURRENT MEDICATIONS:  1. He is taking K-Phos for his recurrent kidney stones.  2. The patient is also taking multiple herbal medicines, over 20  in all. I     do not know the exact contents of them.   SOCIAL HISTORY:  He is a retired Government social research officer. He  lives in  [place]. He is married. He travels extensively and was recently in  [place] quite a bit helping his father rebuild a home. He has also been to  [place] recently. He does not smoke. He exercises 3 times a week at the  [facility], and aside from some increasing  shortness of breath,  has never had chest pain.   PHYSICAL EXAMINATION:  GENERAL:  His examination is remarkable for being in  atrial fibrillation at a rate of  about 100. He looks comfortable.  VITAL SIGNS:  The blood pressure is 113/92. He is afebrile.  NECK:  Carotids normal.  LUNGS:  His lungs are clear.  CARDIAC:  S1, S2, normal heart sounds.  ABDOMEN:  Benign.  EXTREMITIES:  Lower extremities with intact pulses and trace edema.  LABORATORY DATA:  His labs are remarkable for CPK of 396 with an MB of 16.6  and a troponin of 0.12. His EKG shows no acute changes but relatively rapid  atrial fibrillation with nonspecific STT wave changes.   IMPRESSION:  Joellyn Rued and myself had a long discussion with Mr. Buening.  He understands the need for further cardiac  workup given  his symptoms of  shortness of breath may be an anginal equivalent and his positive enzymes.  We recommended a right and left heart  catheterization to the patient. The  risks including stroke and emergency surgery were reviewed and he is willing  to proceed.   Given the fact that he is currently asymptomatic and seems to bleed easily  with aspirin, I think we will just place him on heparin and a baby aspirin  for the time being. We will not put him on Integrilin. His platelet count is  below normal. After his right and left heart  catheterization,  we will need  to deal with his fibrillation. We will start him on Lopressor 25 b.i.d. for  better  rate control.   Eventually he will need to be on Coumadin with an eye towards cardioversion.  However, we need to further  assess his LV function and coronary status  before worrying  about anything but rate control at this point. The above  plan was gone over with the patient and he is in full agreement.                                               Charlton Haws, M.D.    PN/MEDQ  D:  02/11/2003  T:  02/11/2003  Job:  [id number]

## 2010-06-12 NOTE — Assessment & Plan Note (Signed)
[facility]                              CARDIOLOGY OFFICE NOTE   Chad Tucker, Chad Tucker                        MRN:          [id number]  DATE:11/10/2005                            DOB:          Jun 12, 1933    TELEPHONE DOCUMENTATION   The patient was seen in the Coumadin Clinic and I was asked to review his  list of over-the-counter and herbal medications that he is taking for  potential drug interactions.  This list has been appended to the patient's  chart.  We contacted the patient in the afternoon.  I have discussed with  him that zinc 50 mg, DHEA 25 mg, selenium 200 mcg appear to be okay with no  obvious drug interactions.  Additionally, the saw palmetto combination,  folic acid 800 mcg, lutein 40 mg, iron 14 mg, boron 3 mg, fish oil 1 g,  cayenne 450 mg, and glucosamine MSM as well as his multivitamin and  bromelain appear to be acceptable as well.  I have cautioned the patient  against using corticaps as I have no information regarding that combination.  Additionally, as the patient is taking simvastatin, I have cautioned him  about concomitant use of red yeast rice 600 mg and niacin 300 mg, as this  may increase his likelihood of muscle aches and pains.  I have also  cautioned the patient regarding yohimbe use as I am not familiar with data.  He cannot recall why he was taking this and is of note he is taking Viagra  so that is potential concern.  I have indicated I will complete a literature  search and follow back up with him.  His other medications appear to be  okay.  He will monitor per increased risk of bleeding.  The patient states  further into the discussion when he started taking his warfarin therapy he  discontinue the St. John's wart, his CoQ10, his garlic, and his gingko  biloba.  I  have cautioned the patient to continue to avoid gingko as this  will increase the risk of spontaneous bleeding.  Garlic is acceptable in  dietary  intake only.  Coenzyme Q10 is an acceptable risk but St. John's wart  is not proven to be significantly efficacious or helpful in these matters.  The patient will follow up as per his normal schedule.      ______________________________  Shelby Dubin, PharmD, BCPS    ______________________________  Rollene Rotunda, MD, [facility]    MP/MedQ  DD:  11/10/2005  DT:  11/11/2005  Job #:  [id number]

## 2010-06-12 NOTE — Cardiovascular Report (Signed)
NAME:  JOHNTHAN, AXTMAN NO.:  [id number]   MEDICAL RECORD NO.:  [id number]                   PATIENT TYPE:  INP   LOCATION:  2922                                 FACILITY:  [facility]   PHYSICIAN:  Charlies Constable, M.D.                  DATE OF BIRTH:  January 17, 1934   DATE OF PROCEDURE:  02/12/2003  DATE OF DISCHARGE:                              CARDIAC CATHETERIZATION   CLINICAL HISTORY:  Mr. Mall is 77 years old and yesterday was scheduled to  come in for kidney surgery by Dr. Aldean Ast and was found to be in rapid  atrial fibrillation.  He was admitted to the hospital and his enzymes  returned positive for a non-ST elevation myocardial infarction.  This  morning he became somewhat weak and hypotensive.  He was brought over to our  holding area in anticipation of evaluation with cardiac catheterization and  he became quite hypotensive with blood pressures in the 60-70 range a pulse  rate down in the 40s with slow atrial fibrillation.  We treated him with  atropine, dopamine and fluids and his pressure improved and we brought him  urgently to the cath lab for further evaluation.   PROCEDURE:  Left heart catheterization was performed percutaneously via the  right femoral artery using arterial sheath and 6 French preformed coronary  catheters. A front wall arterial puncture was performed and Omnipaque  contrast was used.  Right heart catheterization was performed percutaneously  via the right femoral vein using a medium sheath and Swan-Ganz  thermodilution catheter.  The patient tolerated the procedure well and left  the laboratory in stable, but critical condition.   RESULTS:  Left main coronary artery:  The left main coronary was free of  significant disease.   Left anterior descending artery:  The left anterior descending artery gave  rise to three diagonal branches and four septal perforators.  There was  irregularity in the proximal mid LAD and there was  70% ostial stenosis in  the second diagonal branch.  There was TIMI-2 flow distally.   Circumflex artery:  The circumflex artery was a co-dominant vessel that gave  rise to a large marginal branch, second marginal branch and a posterior  lateral branch.  This vessel was free of significant disease, but the flow  was TIMI-2 flow.   Right coronary artery:  The right coronary artery was a small to moderate  size vessel which gave rise to a conus branch, two right ventricular  branches, posterior descending branch, and a small posterior lateral branch.  There was 30% narrowing in the proximal right coronary artery and 30%  narrowing and 40% narrowing in the mid right coronary artery. There was also  TIMI-2 flow distally.   LEFT VENTRICULOGRAM:  The left ventriculogram performed in the RAO  projection showed global hypokinesis with an estimated ejection fraction of  30%.   HEMODYNAMIC DATA:  The  right atrial pressure was 24 mean.  The pulmonary  artery pressure was 48/27 with mean of 38.  Pulmonary wedge pressure was 31  mean.  Left ventricular pressure was 90/27.  The aortic pressure was 90/48  with a mean of 67 on 20 mg of dopamine.  The cardiac output/cardiac index by  Fick was 7.0/3.0 liters/minute/sq m.  The pulmonary artery saturation was  74%.   CONCLUSIONS:  1. Acute severe left ventricular dysfunction with congestive heart failure     and shock.  2. Nonobstructive coronary artery disease.  3. Ejection fraction of 30%.   RECOMMENDATIONS:  The patient is quite sick with markedly elevated filling  pressures and hypotension despite treatment with dopamine and diuretics.  He  also has a atrial fibrillation and his rate now is about 70.  The etiology  of his severe left ventricular dysfunction is not clear.  It possibly could  be tachycardia-induced cardiomyopathy or possible viral cardiomyopathy.  We  will plan to support him with diuresis and continued dopamine in hopes that   his LV function will improve.  Paradoxically, his cardiac output appears  quite good. Will get thyroid studies to evaluate this.                                               Charlies Constable, M.D.    BB/MEDQ  D:  02/12/2003  T:  02/13/2003  Job:  [id number]   cc:   Tinnie Gens A. Tawanna Cooler, M.D. [facility]   Charlton Haws, M.D.

## 2010-06-12 NOTE — Op Note (Signed)
NAME:  Chad Tucker, Chad Tucker NO.:  [id number]   MEDICAL RECORD NO.:  [id number]                   PATIENT TYPE:  AMB   LOCATION:  [facility]                                 FACILITY:  [facility]   PHYSICIAN:  Rozanna Boer., M.D.      DATE OF BIRTH:  January 10, 1934   DATE OF PROCEDURE:  07/24/2003  DATE OF DISCHARGE:                                 OPERATIVE REPORT   PREOPERATIVE DIAGNOSIS:  Right distal ureteral stone.   POSTOPERATIVE DIAGNOSES:  1. Right distal ureteral stone.  2. Questionable mass, right retroperitoneum.   PROCEDURE:  Ureteroscopy, stone extraction right with right ureteral stent.   ANESTHESIA:  General.   SURGEON:  Courtney Paris, M.D.   BRIEF HISTORY:  This 77 year old patient has had a persistent 6 mm stone in  the right distal ureter for probably a year and a half.  He was to have this  removed 6 months ago but developed atrial fibrillation, had to be  anticoagulated.  He has gone off the Coumadin, May 20, and enters now for  ureteroscopy and stone extraction.  He has had intermittent pain and  hematuria from this.   DESCRIPTION OF PROCEDURE:  The patient was placed on the operating table in  dorsal lithotomy position.  After satisfactory induction of general  anesthesia, was prepped and draped with Betadine in usual sterile fashion  and given IV Cipro.  The anterior urethra was normal; no stricture seen.  Posterior urethra showed mildly enlarged prostate in a trilobar fashion.  The bladder was entered.  No bladder mucosal lesions were seen.  The right  orifice was catheterized with an open-ended 6 ureteral catheter, and an  occlusive retrograde demonstrated a stone in the distal ureter.  I did not  see the proximal ureter because he was quite tall and did not show this up  on the retrograde.  Then passed a guidewire up to the level of the kidney  and then over the guidewire passed a 4 cm ureteral balloon dilator, inflated  this to 12 atmospheres for 4 timed minutes.  The balloon dilator was then  removed, leaving the stent in place.  A #6 short ureteroscope was then  passed up the ureter to the level of the stone and passed it, and the stone  was engaged with a basket and removed intact.  Over the guidewire, I passed  a #6 French 28 cm length double-J ureteral stent but when I checked the  proximal placement of this, there was a mass effect deviating the ureter in  its mid portion medially.  There was a rather marked deviation of the ureter  that I did not suspect with the previous films, so I cut the string off the  stent and left this in place.  Removed the guidewire.  The stent was in good  position.  The patient was then taken to the recovery room in good  condition.  He got  some Toradol IV and a B&O suppository and will be later  discharged as an outpatient.  We plan to do a CT scan to check for  retroperitoneal adenopathy or masses before I remove the stent later next  week.  The stone will be sent for analysis.                                               Rozanna Boer., M.D.    HMK/MEDQ  D:  07/24/2003  T:  07/24/2003  Job:  [id number]

## 2010-06-30 ENCOUNTER — Ambulatory Visit (INDEPENDENT_AMBULATORY_CARE_PROVIDER_SITE_OTHER): Payer: Medicare Other | Admitting: *Deleted

## 2010-06-30 DIAGNOSIS — I4891 Unspecified atrial fibrillation: Secondary | ICD-10-CM

## 2010-07-30 ENCOUNTER — Ambulatory Visit (INDEPENDENT_AMBULATORY_CARE_PROVIDER_SITE_OTHER): Payer: Medicare Other | Admitting: *Deleted

## 2010-07-30 ENCOUNTER — Encounter: Payer: Self-pay | Admitting: Cardiovascular Disease

## 2010-07-30 DIAGNOSIS — I4891 Unspecified atrial fibrillation: Secondary | ICD-10-CM

## 2010-07-30 LAB — POCT INR: INR: 2.9

## 2010-09-01 ENCOUNTER — Other Ambulatory Visit: Payer: Self-pay | Admitting: Cardiology

## 2010-09-01 ENCOUNTER — Encounter: Payer: Medicare Other | Admitting: *Deleted

## 2010-09-02 ENCOUNTER — Ambulatory Visit (INDEPENDENT_AMBULATORY_CARE_PROVIDER_SITE_OTHER): Payer: Medicare Other | Admitting: *Deleted

## 2010-09-02 DIAGNOSIS — I4891 Unspecified atrial fibrillation: Secondary | ICD-10-CM

## 2010-10-01 ENCOUNTER — Ambulatory Visit (INDEPENDENT_AMBULATORY_CARE_PROVIDER_SITE_OTHER): Payer: Medicare Other | Admitting: *Deleted

## 2010-10-01 DIAGNOSIS — I4891 Unspecified atrial fibrillation: Secondary | ICD-10-CM

## 2010-10-12 ENCOUNTER — Telehealth: Payer: Self-pay | Admitting: Family Medicine

## 2010-10-12 DIAGNOSIS — D510 Vitamin B12 deficiency anemia due to intrinsic factor deficiency: Secondary | ICD-10-CM

## 2010-10-12 NOTE — Telephone Encounter (Signed)
Pt would like a b12 lab test. Ok to order?

## 2010-10-12 NOTE — Telephone Encounter (Signed)
ok 

## 2010-10-12 NOTE — Telephone Encounter (Signed)
Lab ordered and appointment made 

## 2010-10-13 ENCOUNTER — Other Ambulatory Visit (INDEPENDENT_AMBULATORY_CARE_PROVIDER_SITE_OTHER): Payer: Medicare Other

## 2010-10-13 DIAGNOSIS — E538 Deficiency of other specified B group vitamins: Secondary | ICD-10-CM

## 2010-10-13 LAB — VITAMIN B12: Vitamin B-12: 693 pg/mL (ref 211–911)

## 2010-10-29 ENCOUNTER — Encounter: Payer: Medicare Other | Admitting: *Deleted

## 2010-12-08 ENCOUNTER — Ambulatory Visit (INDEPENDENT_AMBULATORY_CARE_PROVIDER_SITE_OTHER): Payer: Medicare Other | Admitting: *Deleted

## 2010-12-08 DIAGNOSIS — Z7901 Long term (current) use of anticoagulants: Secondary | ICD-10-CM

## 2010-12-08 DIAGNOSIS — I4891 Unspecified atrial fibrillation: Secondary | ICD-10-CM

## 2010-12-08 LAB — POCT INR: INR: 2.1

## 2011-01-29 ENCOUNTER — Ambulatory Visit (INDEPENDENT_AMBULATORY_CARE_PROVIDER_SITE_OTHER): Payer: Medicare Other | Admitting: *Deleted

## 2011-01-29 DIAGNOSIS — I4891 Unspecified atrial fibrillation: Secondary | ICD-10-CM

## 2011-01-29 LAB — POCT INR: INR: 2.4

## 2011-03-15 ENCOUNTER — Other Ambulatory Visit: Payer: Self-pay | Admitting: Cardiovascular Disease

## 2011-03-16 ENCOUNTER — Ambulatory Visit (INDEPENDENT_AMBULATORY_CARE_PROVIDER_SITE_OTHER): Payer: Medicare Other | Admitting: *Deleted

## 2011-03-16 DIAGNOSIS — Z7901 Long term (current) use of anticoagulants: Secondary | ICD-10-CM

## 2011-03-16 DIAGNOSIS — I4891 Unspecified atrial fibrillation: Secondary | ICD-10-CM

## 2011-03-29 ENCOUNTER — Ambulatory Visit: Payer: Medicare Other | Admitting: Family Medicine

## 2011-04-21 ENCOUNTER — Ambulatory Visit (INDEPENDENT_AMBULATORY_CARE_PROVIDER_SITE_OTHER): Payer: Medicare Other | Admitting: Family Medicine

## 2011-04-21 ENCOUNTER — Encounter: Payer: Self-pay | Admitting: Family Medicine

## 2011-04-21 VITALS — BP 110/78 | Temp 98.3°F | Ht 72.0 in | Wt 223.0 lb

## 2011-04-21 DIAGNOSIS — E785 Hyperlipidemia, unspecified: Secondary | ICD-10-CM

## 2011-04-21 DIAGNOSIS — N138 Other obstructive and reflux uropathy: Secondary | ICD-10-CM

## 2011-04-21 DIAGNOSIS — L719 Rosacea, unspecified: Secondary | ICD-10-CM

## 2011-04-21 DIAGNOSIS — D510 Vitamin B12 deficiency anemia due to intrinsic factor deficiency: Secondary | ICD-10-CM

## 2011-04-21 DIAGNOSIS — M109 Gout, unspecified: Secondary | ICD-10-CM

## 2011-04-21 DIAGNOSIS — I4891 Unspecified atrial fibrillation: Secondary | ICD-10-CM

## 2011-04-21 DIAGNOSIS — I10 Essential (primary) hypertension: Secondary | ICD-10-CM

## 2011-04-21 DIAGNOSIS — Z0000 Encounter for general adult medical examination without abnormal findings: Secondary | ICD-10-CM

## 2011-04-21 DIAGNOSIS — E782 Mixed hyperlipidemia: Secondary | ICD-10-CM

## 2011-04-21 DIAGNOSIS — K1370 Unspecified lesions of oral mucosa: Secondary | ICD-10-CM

## 2011-04-21 LAB — LIPID PANEL
Cholesterol: 189 mg/dL (ref 0–200)
LDL Cholesterol: 93 mg/dL (ref 0–99)
Triglycerides: 114 mg/dL (ref 0.0–149.0)
VLDL: 22.8 mg/dL (ref 0.0–40.0)

## 2011-04-21 LAB — HEPATIC FUNCTION PANEL
ALT: 21 U/L (ref 0–53)
Total Bilirubin: 0.8 mg/dL (ref 0.3–1.2)

## 2011-04-21 LAB — POCT URINALYSIS DIPSTICK
Ketones, UA: NEGATIVE
Leukocytes, UA: NEGATIVE
Protein, UA: NEGATIVE
Urobilinogen, UA: 0.2

## 2011-04-21 LAB — CBC WITH DIFFERENTIAL/PLATELET
Eosinophils Relative: 2.4 % (ref 0.0–5.0)
Lymphocytes Relative: 27.7 % (ref 12.0–46.0)
Monocytes Relative: 10 % (ref 3.0–12.0)
Neutrophils Relative %: 59.4 % (ref 43.0–77.0)
Platelets: 141 10*3/uL — ABNORMAL LOW (ref 150.0–400.0)
WBC: 4 10*3/uL — ABNORMAL LOW (ref 4.5–10.5)

## 2011-04-21 LAB — VITAMIN B12: Vitamin B-12: 464 pg/mL (ref 211–911)

## 2011-04-21 LAB — BASIC METABOLIC PANEL
Calcium: 9.1 mg/dL (ref 8.4–10.5)
Creatinine, Ser: 0.9 mg/dL (ref 0.4–1.5)

## 2011-04-21 LAB — TSH: TSH: 1.37 u[IU]/mL (ref 0.35–5.50)

## 2011-04-21 MED ORDER — METRONIDAZOLE 1 % EX GEL: Freq: Every day | CUTANEOUS | Status: DC

## 2011-04-21 MED ORDER — SIMVASTATIN 10 MG PO TABS: 10.0000 mg | ORAL_TABLET | Freq: Every day | ORAL | Status: DC

## 2011-04-21 MED ORDER — ALLOPURINOL 300 MG PO TABS: 300.0000 mg | ORAL_TABLET | Freq: Every day | ORAL | Status: DC

## 2011-04-21 MED ORDER — METOPROLOL SUCCINATE ER 50 MG PO TB24: 50.0000 mg | ORAL_TABLET | Freq: Every day | ORAL | Status: DC

## 2011-04-21 MED ORDER — DOXYCYCLINE HYCLATE 100 MG PO TABS: 100.0000 mg | ORAL_TABLET | Freq: Two times a day (BID) | ORAL | Status: AC

## 2011-04-21 NOTE — Progress Notes (Signed)
Subjective:    Patient ID: Chad Tucker, male    DOB: 1934/01/08, 78 y.o.   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 Jackey Tucker,,,,,,,, his cardiologist he stood to see him in the summer  He takes a pill for of over-the-counter Ruso herbs  He sees his urologist yearly for prostate check and his preference  He gets routine eye care, hearing normal, regular dental care, recent colonoscopy normal, tetanus 2006, Pneumovax x2, shingles 2008  Cognitive function normal he walks on a regular basis pulmonal safety reviewed no issues identified, no guns in the house, he does have a health care power of attorney and a living well.  His only complaint today is a sore spot on the right side of his tongue for 2 months  He also has a history of B12 deficiency and we had him on B12 injections however he stopped about 6 months ago and is taking oral B12 to see if that will    Review of Systems  Constitutional: Negative.   HENT: Negative.   Eyes: Negative.   Respiratory: Negative.   Cardiovascular: Negative.   Gastrointestinal: Negative.   Genitourinary: Negative.   Musculoskeletal: Negative.   Skin: Negative.   Neurological: Negative.   Hematological: Negative.   Psychiatric/Behavioral: Negative.        Objective:   Physical Exam  Constitutional: He is oriented to person, place, and time. He appears well-developed and well-nourished.  HENT:  Head: Normocephalic and  atraumatic.  Right Ear: External ear normal.  Left Ear: External ear normal.  Nose: Nose normal.  Mouth/Throat: Oropharynx is clear and moist.  Eyes: Conjunctivae and EOM are normal. Pupils are equal, round, and reactive to light.  Neck: Normal range of motion. Neck supple. No JVD present. No tracheal deviation present. No thyromegaly present.  Cardiovascular: Normal rate, normal heart sounds and intact distal pulses.  Exam reveals no gallop and no friction rub.   No murmur heard.      Pulse 70-80 irregularly irregular consistent with chronic atrial fib  Pulmonary/Chest: Effort normal and breath sounds normal. No stridor. No respiratory distress. He has no wheezes. He has no rales. He exhibits no tenderness.  Abdominal: Soft. Bowel sounds are normal. He exhibits no distension and no mass. There is no tenderness. There is no rebound and no guarding.  Musculoskeletal: Normal range of motion. He exhibits no edema and no tenderness.  Lymphadenopathy:    He has no cervical adenopathy.  Neurological: He is alert and oriented to person, place, and time. He has normal reflexes. No cranial nerve deficit. He exhibits normal muscle tone.  Skin: Skin is warm and dry. No rash noted. No erythema. No pallor.  Psychiatric: He has a normal mood and affect. His behavior is normal. Judgment and thought content normal.          Assessment & Plan:  Healthy male  History of gout continue allopurinol  History of stable atrial fib continue Toprol  25 mg twice a day and Coumadin  Hyperlipidemia continue Zocor and aspirin check lipid panel  Rosacea continue MetroGel  A sore lesion right side of his tongue referred to Dr. Manson Tucker oral surgeon for consultation  History of B12 deficiency check labs  History of skin cancer on his scalp currently asymptomatic skin exam normal

## 2011-04-21 NOTE — Patient Instructions (Signed)
Continue current medications  Return in one year sooner if any problems  Call Dr. Cherlyn Cushing oral surgeon for consultation on the sore lesion on the right side of your tongue

## 2011-04-27 ENCOUNTER — Ambulatory Visit (INDEPENDENT_AMBULATORY_CARE_PROVIDER_SITE_OTHER): Payer: Medicare Other | Admitting: Pharmacist

## 2011-04-27 DIAGNOSIS — I4891 Unspecified atrial fibrillation: Secondary | ICD-10-CM

## 2011-04-27 DIAGNOSIS — Z7901 Long term (current) use of anticoagulants: Secondary | ICD-10-CM

## 2011-05-26 ENCOUNTER — Other Ambulatory Visit: Payer: Self-pay | Admitting: *Deleted

## 2011-05-26 DIAGNOSIS — I10 Essential (primary) hypertension: Secondary | ICD-10-CM

## 2011-05-26 DIAGNOSIS — I4891 Unspecified atrial fibrillation: Secondary | ICD-10-CM

## 2011-05-26 MED ORDER — METOPROLOL SUCCINATE ER 50 MG PO TB24: 50.0000 mg | ORAL_TABLET | Freq: Every day | ORAL | Status: DC

## 2011-05-31 ENCOUNTER — Other Ambulatory Visit: Payer: Self-pay

## 2011-05-31 DIAGNOSIS — I10 Essential (primary) hypertension: Secondary | ICD-10-CM

## 2011-05-31 DIAGNOSIS — I4891 Unspecified atrial fibrillation: Secondary | ICD-10-CM

## 2011-05-31 MED ORDER — METOPROLOL SUCCINATE ER 50 MG PO TB24: 50.0000 mg | ORAL_TABLET | Freq: Every day | ORAL | Status: DC

## 2011-06-01 ENCOUNTER — Telehealth: Payer: Self-pay | Admitting: Cardiovascular Disease

## 2011-06-01 NOTE — Telephone Encounter (Signed)
Spoke to pharmacy refill is suppose to be 1/2 tab bid.

## 2011-06-01 NOTE — Telephone Encounter (Signed)
[facility]  [phone]   Need verification on dosage instructions  for 05/31/11 refill of metoprolol,  Please return call to [facility] at [phone] Reference# [id number]

## 2011-06-08 ENCOUNTER — Ambulatory Visit (INDEPENDENT_AMBULATORY_CARE_PROVIDER_SITE_OTHER): Payer: Medicare Other | Admitting: Pharmacist

## 2011-06-08 DIAGNOSIS — Z7901 Long term (current) use of anticoagulants: Secondary | ICD-10-CM

## 2011-06-08 DIAGNOSIS — I4891 Unspecified atrial fibrillation: Secondary | ICD-10-CM

## 2011-08-06 ENCOUNTER — Ambulatory Visit (INDEPENDENT_AMBULATORY_CARE_PROVIDER_SITE_OTHER): Payer: Medicare Other | Admitting: *Deleted

## 2011-08-06 DIAGNOSIS — Z7901 Long term (current) use of anticoagulants: Secondary | ICD-10-CM

## 2011-08-06 DIAGNOSIS — I4891 Unspecified atrial fibrillation: Secondary | ICD-10-CM

## 2011-08-06 LAB — POCT INR: INR: 1.9

## 2011-08-14 ENCOUNTER — Other Ambulatory Visit: Payer: Self-pay | Admitting: Cardiovascular Disease

## 2011-09-10 ENCOUNTER — Ambulatory Visit (INDEPENDENT_AMBULATORY_CARE_PROVIDER_SITE_OTHER): Payer: Medicare Other | Admitting: *Deleted

## 2011-09-10 DIAGNOSIS — Z7901 Long term (current) use of anticoagulants: Secondary | ICD-10-CM

## 2011-09-10 DIAGNOSIS — I4891 Unspecified atrial fibrillation: Secondary | ICD-10-CM

## 2011-10-15 ENCOUNTER — Ambulatory Visit (INDEPENDENT_AMBULATORY_CARE_PROVIDER_SITE_OTHER): Payer: Medicare Other

## 2011-10-15 DIAGNOSIS — I4891 Unspecified atrial fibrillation: Secondary | ICD-10-CM

## 2011-10-15 DIAGNOSIS — Z7901 Long term (current) use of anticoagulants: Secondary | ICD-10-CM

## 2011-10-20 ENCOUNTER — Ambulatory Visit (INDEPENDENT_AMBULATORY_CARE_PROVIDER_SITE_OTHER): Payer: Medicare Other | Admitting: Cardiovascular Disease

## 2011-10-20 ENCOUNTER — Encounter: Payer: Self-pay | Admitting: Cardiovascular Disease

## 2011-10-20 VITALS — BP 115/80 | HR 80 | Ht 73.0 in | Wt 200.0 lb

## 2011-10-20 DIAGNOSIS — E782 Mixed hyperlipidemia: Secondary | ICD-10-CM

## 2011-10-20 DIAGNOSIS — I10 Essential (primary) hypertension: Secondary | ICD-10-CM

## 2011-10-20 DIAGNOSIS — I4891 Unspecified atrial fibrillation: Secondary | ICD-10-CM

## 2011-10-20 NOTE — Assessment & Plan Note (Signed)
Cholesterol is at goal.  Continue current dose of statin and diet Rx.  No myalgias or side effects.  F/U  LFT's in 6 months. Lab Results  Component Value Date   LDLCALC 93 04/21/2011

## 2011-10-20 NOTE — Progress Notes (Signed)
Patient ID: Chad Tucker, male   DOB: December 17, 1933, 78 y.o.   MRN: [id number] Chad Tucker is seen today in followup for anticoagulations atrial fibrillation and hyperlipidemia. He has no documented coronary artery disease. Previous echo EF 50-55% I reviewed his echo from today and EF is normal with mild biatrial enlargement. He has had no signs of congestive failure. He gets very infrequent palpitations. He has not had any chest pain PND orthopnea or lower extremity edema. His anticoagulation has been therapeutic without bleeding. He is on statin therapy with normal LFTs. In February his LDL was approximately 114. This is within target range for someone with no known coronary disease. Otherwise he's been doing well.  We discussed Pradaxa but he agreed to wait until an antidote for it is marketed  Has lost a lot of weight since I last saw him and looks great   ROS: Denies fever, malais, weight loss, blurry vision, decreased visual acuity, cough, sputum, SOB, hemoptysis, pleuritic pain, palpitaitons, heartburn, abdominal pain, melena, lower extremity edema, claudication, or rash.  All other systems reviewed and negative  General: Affect appropriate Healthy:  appears stated age HEENT: normal Neck supple with no adenopathy JVP normal no bruits no thyromegaly Lungs clear with no wheezing and good diaphragmatic motion Heart:  S1/S2 no murmur, no rub, gallop or click PMI normal Abdomen: benighn, BS positve, no tenderness, no AAA no bruit.  No HSM or HJR Distal pulses intact with no bruits No edema Neuro non-focal Skin warm and dry No muscular weakness   Current Outpatient Prescriptions  Medication Sig Dispense Refill  . allopurinol (ZYLOPRIM) 300 MG tablet Take 1 tablet (300 mg total) by mouth daily.  100 tablet  3  . Ascorbic Acid (VITAMIN C) 100 MG tablet Take 250 mg by mouth daily.       . Black Currant Seed Oil (GAMMA-LINC 500) 500 MG CAPS Take by mouth.        . Boron 3 MG CAPS Take by mouth.         . Bromelains 500 MG TABS Take by mouth.        . Cholecalciferol ([facility] VIT D 5000 HIGH-POTENCY PO) Take by mouth every other day.       . Choline Bitartrate 250 MG TABS Take by mouth.        . Cinnamon 500 MG capsule Take 500 mg by mouth daily.        Marland Kitchen DHEA 25 MG CAPS Take by mouth.        . fish oil-omega-3 fatty acids 1000 MG capsule Take 2 g by mouth daily.        . folic acid (FOLVITE) 800 MCG tablet Take 200 mcg by mouth daily.       . Ginger, Zingiber officinalis, (GINGER ROOT) 550 MG CAPS Take by mouth.        . Ginkgo Biloba 40 MG TABS Take by mouth.        . Lutein 20 MG TABS Take by mouth.        . metoprolol succinate (TOPROL-XL) 50 MG 24 hr tablet Half tab twice      . metroNIDAZOLE (METROGEL) 1 % gel Apply topically daily.  180 g  11  . Misc Natural Products (SUPER OAT BRAN) 1000 MG TABS Take by mouth.        . multivitamin (THERAGRAN) per tablet Take 1 tablet by mouth daily.        . niacin (NIASPAN) 1000 MG CR tablet Take  1,000 mg by mouth daily.       . phosphorus (K PHOS NEUTRAL) 155-852-130 MG tablet Take 1 tablet by mouth 2 (two) times daily.        Marland Kitchen RESVERATROL 100 MG CAPS Take by mouth.        . saw palmetto 160 MG capsule Take 160 mg by mouth 2 (two) times daily.        Marland Kitchen selenium 50 MCG TABS Take 50 mcg by mouth daily.        . simvastatin (ZOCOR) 10 MG tablet Take 1 tablet (10 mg total) by mouth at bedtime.  100 tablet  3  . Specialty Vitamins Products (MAGNESIUM, AMINO ACID CHELATE,) 133 MG tablet Take 1 tablet by mouth 2 (two) times daily.        . Turmeric 500 MG CAPS Take by mouth.        . warfarin (COUMADIN) 3 MG tablet TAKE AS DIRECTED BY ANTICOAGULATION  CLINIC  140 tablet  1  . Yohimbe Bark 500 MG CAPS Take by mouth.        . Zinc 100 MG TABS Take by mouth.          Allergies  Review of patient's allergies indicates no known allergies.  Electrocardiogram:  afib rate 75  Otherwise normal  Assessment and Plan

## 2011-10-20 NOTE — Assessment & Plan Note (Signed)
Well controlled.  Continue current medications and low sodium Dash type diet.    

## 2011-10-20 NOTE — Patient Instructions (Signed)
Your physician wants you to follow-up in: YEAR WITH DR NISHAN  You will receive a reminder letter in the mail two months in advance. If you don't receive a letter, please call our office to schedule the follow-up appointment.  Your physician recommends that you continue on your current medications as directed. Please refer to the Current Medication list given to you today. 

## 2011-10-20 NOTE — Assessment & Plan Note (Signed)
Good rate control and anticoagulation Does not want to switch to novel agent

## 2011-11-26 ENCOUNTER — Ambulatory Visit (INDEPENDENT_AMBULATORY_CARE_PROVIDER_SITE_OTHER): Payer: Medicare Other | Admitting: *Deleted

## 2011-11-26 DIAGNOSIS — I4891 Unspecified atrial fibrillation: Secondary | ICD-10-CM

## 2011-11-26 DIAGNOSIS — Z7901 Long term (current) use of anticoagulants: Secondary | ICD-10-CM

## 2011-11-26 LAB — POCT INR: INR: 2.4

## 2012-01-07 ENCOUNTER — Ambulatory Visit (INDEPENDENT_AMBULATORY_CARE_PROVIDER_SITE_OTHER): Payer: Medicare Other | Admitting: *Deleted

## 2012-01-07 DIAGNOSIS — I4891 Unspecified atrial fibrillation: Secondary | ICD-10-CM

## 2012-01-07 DIAGNOSIS — Z7901 Long term (current) use of anticoagulants: Secondary | ICD-10-CM

## 2012-01-07 LAB — POCT INR: INR: 2.5

## 2012-02-22 ENCOUNTER — Ambulatory Visit (INDEPENDENT_AMBULATORY_CARE_PROVIDER_SITE_OTHER): Payer: Medicare Other | Admitting: Pharmacist

## 2012-02-22 DIAGNOSIS — I4891 Unspecified atrial fibrillation: Secondary | ICD-10-CM

## 2012-02-22 DIAGNOSIS — Z7901 Long term (current) use of anticoagulants: Secondary | ICD-10-CM

## 2012-02-22 LAB — POCT INR: INR: 2.6

## 2012-04-13 ENCOUNTER — Ambulatory Visit (INDEPENDENT_AMBULATORY_CARE_PROVIDER_SITE_OTHER): Payer: Medicare Other | Admitting: *Deleted

## 2012-04-13 DIAGNOSIS — Z7901 Long term (current) use of anticoagulants: Secondary | ICD-10-CM

## 2012-04-13 LAB — POCT INR: INR: 2.1

## 2012-04-24 ENCOUNTER — Encounter: Payer: Medicare Other | Admitting: Family Medicine

## 2012-05-16 ENCOUNTER — Encounter: Payer: Self-pay | Admitting: Family Medicine

## 2012-05-16 ENCOUNTER — Ambulatory Visit (INDEPENDENT_AMBULATORY_CARE_PROVIDER_SITE_OTHER): Payer: Medicare Other | Admitting: Family Medicine

## 2012-05-16 VITALS — BP 124/78 | HR 68 | Temp 98.5°F | Resp 16 | Ht 73.0 in | Wt 208.0 lb

## 2012-05-16 DIAGNOSIS — L719 Rosacea, unspecified: Secondary | ICD-10-CM

## 2012-05-16 DIAGNOSIS — D510 Vitamin B12 deficiency anemia due to intrinsic factor deficiency: Secondary | ICD-10-CM

## 2012-05-16 DIAGNOSIS — N401 Enlarged prostate with lower urinary tract symptoms: Secondary | ICD-10-CM

## 2012-05-16 DIAGNOSIS — M109 Gout, unspecified: Secondary | ICD-10-CM

## 2012-05-16 DIAGNOSIS — Z0000 Encounter for general adult medical examination without abnormal findings: Secondary | ICD-10-CM

## 2012-05-16 DIAGNOSIS — E782 Mixed hyperlipidemia: Secondary | ICD-10-CM

## 2012-05-16 DIAGNOSIS — D649 Anemia, unspecified: Secondary | ICD-10-CM

## 2012-05-16 DIAGNOSIS — I4891 Unspecified atrial fibrillation: Secondary | ICD-10-CM

## 2012-05-16 DIAGNOSIS — R252 Cramp and spasm: Secondary | ICD-10-CM

## 2012-05-16 DIAGNOSIS — I10 Essential (primary) hypertension: Secondary | ICD-10-CM

## 2012-05-16 DIAGNOSIS — N529 Male erectile dysfunction, unspecified: Secondary | ICD-10-CM

## 2012-05-16 LAB — POCT URINALYSIS DIPSTICK
Bilirubin, UA: NEGATIVE
Glucose, UA: NEGATIVE
Leukocytes, UA: NEGATIVE
Nitrite, UA: NEGATIVE
pH, UA: 5.5

## 2012-05-16 LAB — CBC WITH DIFFERENTIAL/PLATELET
Basophils Absolute: 0 10*3/uL (ref 0.0–0.1)
Lymphocytes Relative: 22.9 % (ref 12.0–46.0)
Lymphs Abs: 1 10*3/uL (ref 0.7–4.0)
Monocytes Relative: 8.2 % (ref 3.0–12.0)
Neutrophils Relative %: 66.1 % (ref 43.0–77.0)
Platelets: 130 10*3/uL — ABNORMAL LOW (ref 150.0–400.0)
RDW: 14.9 % — ABNORMAL HIGH (ref 11.5–14.6)

## 2012-05-16 LAB — BASIC METABOLIC PANEL
BUN: 16 mg/dL (ref 6–23)
GFR: 95.42 mL/min (ref >60.00)
Potassium: 4.3 mEq/L (ref 3.5–5.1)

## 2012-05-16 LAB — LIPID PANEL
Cholesterol: 173 mg/dL (ref 0–200)
LDL Cholesterol: 95 mg/dL (ref 0–99)

## 2012-05-16 LAB — VITAMIN B12: Vitamin B-12: 498 pg/mL (ref 211–911)

## 2012-05-16 LAB — HEPATIC FUNCTION PANEL
Alkaline Phosphatase: 71 U/L (ref 39–117)
Bilirubin, Direct: 0.1 mg/dL (ref 0.0–0.3)
Total Protein: 6.2 g/dL (ref 6.0–8.3)

## 2012-05-16 MED ORDER — DOXYCYCLINE HYCLATE 100 MG PO TABS: 100.0000 mg | ORAL_TABLET | Freq: Two times a day (BID) | ORAL | Status: DC

## 2012-05-16 MED ORDER — ALLOPURINOL 300 MG PO TABS: 300.0000 mg | ORAL_TABLET | Freq: Every day | ORAL | Status: DC

## 2012-05-16 MED ORDER — SIMVASTATIN 10 MG PO TABS: 10.0000 mg | ORAL_TABLET | Freq: Every day | ORAL | Status: DC

## 2012-05-16 MED ORDER — METRONIDAZOLE 1 % EX GEL: Freq: Every day | CUTANEOUS | Status: DC

## 2012-05-16 MED ORDER — METOPROLOL SUCCINATE ER 50 MG PO TB24: ORAL_TABLET | ORAL | Status: DC

## 2012-05-16 NOTE — Patient Instructions (Signed)
Continue your current medications  Again I would recommend not taking any over-the-counter roots herbs nor supplements  Call [facility] and set up a consult time for reevaluation  Return in one year sooner if any problems

## 2012-05-16 NOTE — Progress Notes (Signed)
Subjective:    Patient ID: Chad Tucker, male    DOB: 12/15/1933, 79 y.o.   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 He's had both cataracts removed and lens implants by Dr. Alden Hipp and feels much better.  He gets routine eye care, dental care, colonoscopy recently and GI normal, vaccinations up-to-date  Cognitive function normal he walks on a regular basis home health safety reviewed no issues identified, no guns in the house, he does have a health care power of attorney and living well  5 years ago he had a neurologic evaluation at the insistence of his daughter. His daughter was concerned that he might have early Alzheimer's. He admits to having some trouble with short-term memory and retrieval of names and nouns. I recommend he go back for neurologic reevaluation  We also discussed the numerous over-the-counter roots and urgency stating. I recommend that he stop them however he wishes to continue Review of Systems  Constitutional: Negative.   HENT: Negative.   Eyes: Negative.   Respiratory: Negative.   Cardiovascular: Negative.   Gastrointestinal: Negative.   Genitourinary: Negative.   Musculoskeletal: Negative.   Skin: Negative.   Neurological: Negative.   Psychiatric/Behavioral: Negative.        Objective:   Physical Exam  Constitutional: He is oriented to person, place, and time. He appears well-developed and well-nourished.  HENT:  Head: Normocephalic and atraumatic.  Right Ear: External ear normal.  Left Ear: External ear normal.  Nose: Nose normal.  Mouth/Throat: Oropharynx is clear and moist.  Eyes: Conjunctivae and EOM are normal. Pupils are equal, round, and reactive to light.  Neck: Normal range of motion. Neck supple. No JVD  present. No tracheal deviation present. No thyromegaly present.  Cardiovascular: Normal rate, regular rhythm, normal heart sounds and intact distal pulses.  Exam reveals no gallop and no friction rub.   No murmur heard. No carotid or aortic bruits peripheral pulses normal  A pulse 70 and regular today  Pulmonary/Chest: Effort normal and breath sounds normal. No stridor. No respiratory distress. He has no wheezes. He has no rales. He exhibits no tenderness.  Abdominal: Soft. Bowel sounds are normal. He exhibits no distension and no mass. There is no tenderness. There is no rebound and no guarding.  Genitourinary: Rectum normal and penis normal. Guaiac negative stool. No penile tenderness.  Prostate exam shows an asymmetry with the right lobe larger than the left. He's due to go back for urology followup this summer  Musculoskeletal: Normal range of motion. He exhibits no edema and no tenderness.  Lymphadenopathy:    He has no cervical adenopathy.  Neurological: He is alert and oriented to person, place, and time. He has normal reflexes. No cranial nerve deficit. He exhibits normal muscle tone.  Skin: Skin is warm and dry. No rash noted. No erythema. No pallor.  Psychiatric: He has a normal mood and affect. His behavior is normal. Judgment and thought content normal.  bilateral clear lens implants        Assessment & Plan:  Healthy male  Atrial fib now in regular sinus rhythm continue current meds and Coumadin followup in the Coumadin clinic  History of gout continue allopurinol one daily  Rosacea continue current therapy with MetroGel and doxycycline when necessary  Hyperlipidemia continue Zocor  20 mg daily along with an aspirin tablet  BPH continue followup in urology check PSA  Concerned about short-term memory recommend neurologic reevaluationis  Status post cataract removal with bilateral lens implants

## 2012-05-19 ENCOUNTER — Encounter: Payer: Self-pay | Admitting: *Deleted

## 2012-05-19 ENCOUNTER — Telehealth: Payer: Self-pay | Admitting: Cardiovascular Disease

## 2012-05-19 ENCOUNTER — Other Ambulatory Visit: Payer: Self-pay | Admitting: *Deleted

## 2012-05-19 DIAGNOSIS — M109 Gout, unspecified: Secondary | ICD-10-CM

## 2012-05-19 DIAGNOSIS — E782 Mixed hyperlipidemia: Secondary | ICD-10-CM

## 2012-05-19 DIAGNOSIS — N529 Male erectile dysfunction, unspecified: Secondary | ICD-10-CM

## 2012-05-19 DIAGNOSIS — L719 Rosacea, unspecified: Secondary | ICD-10-CM

## 2012-05-19 DIAGNOSIS — I10 Essential (primary) hypertension: Secondary | ICD-10-CM

## 2012-05-19 MED ORDER — DOXYCYCLINE HYCLATE 100 MG PO TABS: 100.0000 mg | ORAL_TABLET | Freq: Two times a day (BID) | ORAL | Status: DC

## 2012-05-19 MED ORDER — METOPROLOL SUCCINATE ER 50 MG PO TB24: ORAL_TABLET | ORAL | Status: DC

## 2012-05-19 MED ORDER — "SYRINGE/NEEDLE (DISP) 25G X 5/8"" 1 ML MISC": 1.0000 | Status: DC

## 2012-05-19 MED ORDER — ALLOPURINOL 300 MG PO TABS: 300.0000 mg | ORAL_TABLET | Freq: Every day | ORAL | Status: DC

## 2012-05-19 MED ORDER — SIMVASTATIN 10 MG PO TABS: 10.0000 mg | ORAL_TABLET | Freq: Every day | ORAL | Status: DC

## 2012-05-19 MED ORDER — CYANOCOBALAMIN 1000 MCG/ML IJ SOLN: 1000.0000 ug | Freq: Once | INTRAMUSCULAR | Status: DC

## 2012-05-19 MED ORDER — WARFARIN SODIUM 3 MG PO TABS: ORAL_TABLET | ORAL | Status: DC

## 2012-05-19 MED ORDER — METRONIDAZOLE 1 % EX GEL: Freq: Every day | CUTANEOUS | Status: DC

## 2012-05-19 NOTE — Telephone Encounter (Signed)
Spoke with pt. Rx sent to pharmacy.  

## 2012-05-19 NOTE — Telephone Encounter (Signed)
New Problem:    Patient called in needing a 3 month refill of his warfarin (COUMADIN) 3 MG tablet.  Please call back.

## 2012-05-29 ENCOUNTER — Ambulatory Visit (INDEPENDENT_AMBULATORY_CARE_PROVIDER_SITE_OTHER): Payer: Medicare Other

## 2012-05-29 DIAGNOSIS — I4891 Unspecified atrial fibrillation: Secondary | ICD-10-CM

## 2012-05-29 DIAGNOSIS — Z7901 Long term (current) use of anticoagulants: Secondary | ICD-10-CM

## 2012-06-01 ENCOUNTER — Other Ambulatory Visit: Payer: Self-pay | Admitting: *Deleted

## 2012-06-01 MED ORDER — CYANOCOBALAMIN 1000 MCG/ML IJ SOLN: 1000.0000 ug | Freq: Once | INTRAMUSCULAR | Status: DC

## 2012-06-01 NOTE — Telephone Encounter (Signed)
New prescription sent

## 2012-07-25 ENCOUNTER — Ambulatory Visit (INDEPENDENT_AMBULATORY_CARE_PROVIDER_SITE_OTHER): Payer: Medicare Other | Admitting: *Deleted

## 2012-07-25 DIAGNOSIS — Z7901 Long term (current) use of anticoagulants: Secondary | ICD-10-CM

## 2012-07-25 DIAGNOSIS — I4891 Unspecified atrial fibrillation: Secondary | ICD-10-CM

## 2012-08-14 ENCOUNTER — Telehealth: Payer: Self-pay | Admitting: Family Medicine

## 2012-08-14 DIAGNOSIS — I10 Essential (primary) hypertension: Secondary | ICD-10-CM

## 2012-08-14 NOTE — Telephone Encounter (Signed)
Please call [facility] to verify SIG on metoprolol succinate (TOPROL-XL) 50 MG 24 hr tablet Use reference #: [id number]

## 2012-08-15 MED ORDER — METOPROLOL SUCCINATE ER 50 MG PO TB24: ORAL_TABLET | ORAL | Status: DC

## 2012-08-22 ENCOUNTER — Ambulatory Visit (INDEPENDENT_AMBULATORY_CARE_PROVIDER_SITE_OTHER): Payer: Medicare Other | Admitting: *Deleted

## 2012-08-22 DIAGNOSIS — Z7901 Long term (current) use of anticoagulants: Secondary | ICD-10-CM

## 2012-08-22 DIAGNOSIS — I4891 Unspecified atrial fibrillation: Secondary | ICD-10-CM

## 2012-09-19 ENCOUNTER — Ambulatory Visit (INDEPENDENT_AMBULATORY_CARE_PROVIDER_SITE_OTHER): Payer: Medicare Other | Admitting: Pharmacist

## 2012-09-19 DIAGNOSIS — I4891 Unspecified atrial fibrillation: Secondary | ICD-10-CM

## 2012-09-19 DIAGNOSIS — Z7901 Long term (current) use of anticoagulants: Secondary | ICD-10-CM

## 2012-09-19 LAB — POCT INR: INR: 2.3

## 2012-09-30 ENCOUNTER — Emergency Department (HOSPITAL_COMMUNITY)
Admission: EM | Admit: 2012-09-30 | Discharge: 2012-10-01 | Disposition: A | Discharge status: Home / Self Care | Payer: Medicare Other | Attending: Emergency Medicine | Admitting: Emergency Medicine

## 2012-09-30 ENCOUNTER — Emergency Department (HOSPITAL_COMMUNITY): Payer: Medicare Other

## 2012-09-30 ENCOUNTER — Encounter (HOSPITAL_COMMUNITY): Payer: Self-pay | Admitting: Emergency Medicine

## 2012-09-30 DIAGNOSIS — E785 Hyperlipidemia, unspecified: Secondary | ICD-10-CM | POA: Insufficient documentation

## 2012-09-30 DIAGNOSIS — R110 Nausea: Secondary | ICD-10-CM | POA: Insufficient documentation

## 2012-09-30 DIAGNOSIS — Z8739 Personal history of other diseases of the musculoskeletal system and connective tissue: Secondary | ICD-10-CM | POA: Insufficient documentation

## 2012-09-30 DIAGNOSIS — R197 Diarrhea, unspecified: Secondary | ICD-10-CM | POA: Insufficient documentation

## 2012-09-30 DIAGNOSIS — Z872 Personal history of diseases of the skin and subcutaneous tissue: Secondary | ICD-10-CM | POA: Insufficient documentation

## 2012-09-30 DIAGNOSIS — Z7901 Long term (current) use of anticoagulants: Secondary | ICD-10-CM | POA: Insufficient documentation

## 2012-09-30 DIAGNOSIS — Z79899 Other long term (current) drug therapy: Secondary | ICD-10-CM | POA: Insufficient documentation

## 2012-09-30 DIAGNOSIS — N200 Calculus of kidney: Secondary | ICD-10-CM | POA: Diagnosis present

## 2012-09-30 DIAGNOSIS — I4891 Unspecified atrial fibrillation: Secondary | ICD-10-CM | POA: Insufficient documentation

## 2012-09-30 DIAGNOSIS — E7800 Pure hypercholesterolemia, unspecified: Secondary | ICD-10-CM | POA: Insufficient documentation

## 2012-09-30 DIAGNOSIS — I10 Essential (primary) hypertension: Secondary | ICD-10-CM | POA: Insufficient documentation

## 2012-09-30 DIAGNOSIS — Z87448 Personal history of other diseases of urinary system: Secondary | ICD-10-CM | POA: Insufficient documentation

## 2012-09-30 HISTORY — DX: Disorder of kidney and ureter, unspecified: N28.9

## 2012-09-30 MED ORDER — ONDANSETRON HCL 4 MG/2ML IJ SOLN
4.0000 mg | Freq: Once | INTRAMUSCULAR | Status: AC
  Administered 2012-10-01: 4 mg via INTRAVENOUS
  Filled 2012-09-30: qty 2

## 2012-09-30 MED ORDER — HYDROMORPHONE HCL PF 1 MG/ML IJ SOLN
0.5000 mg | INTRAMUSCULAR | Status: AC
  Administered 2012-10-01: 0.5 mg via INTRAVENOUS
  Filled 2012-09-30: qty 1

## 2012-09-30 MED ORDER — SODIUM CHLORIDE 0.9 % IV BOLUS (SEPSIS)
1000.0000 mL | INTRAVENOUS | Status: AC
Start: 2012-09-30 — End: 2012-10-01
  Administered 2012-10-01: 1000 mL via INTRAVENOUS

## 2012-09-30 NOTE — ED Notes (Signed)
Pt c/o hematuria onset 2 days ago, pain LLQ onset 2 hours ago. sudden onset of nausea in triage

## 2012-09-30 NOTE — ED Provider Notes (Signed)
CSN: [id number]     Arrival date & time 09/30/12  2226 History   First MD Initiated Contact with Patient 09/30/12 2254     Chief Complaint  Patient presents with  . Abdominal Pain  . Hematuria   (Consider location/radiation/quality/duration/timing/severity/associated sxs/prior Treatment) Patient is a 79 y.o. male presenting with abdominal pain and hematuria. The history is provided by the patient.  Abdominal Pain Pain location:  L flank Pain quality: aching and dull   Pain radiates to:  Does not radiate Pain severity:  Moderate Onset quality:  Sudden Duration:  6 hours Timing:  Constant Progression:  Unchanged Chronicity:  New Relieved by:  Nothing Worsened by:  Nothing tried Ineffective treatments:  None tried Associated symptoms: diarrhea (mild), hematuria and nausea   Associated symptoms: no chest pain, no cough, no dysuria, no fever, no shortness of breath and no vomiting   Hematuria Associated symptoms include abdominal pain. Pertinent negatives include no chest pain, no headaches and no shortness of breath.    Past Medical History  Diagnosis Date  . Essential hypertension, benign   . Atrial fibrillation   . HLD (hyperlipidemia)     mixed  . Long term (current) use of anticoagulants     coumadin therapy   . Pure hypercholesterolemia   . Dyspnea   . DDD (degenerative disc disease), cervical     w/radioculopathy  . Rosacea   . ED (erectile dysfunction) of organic origin   . Heart failure     congestive. secondary to afib   . NICM (nonischemic cardiomyopathy)     EF of 30%  . Renal disorder     kidney stones   Past Surgical History  Procedure Laterality Date  . Appendectomy    . Tonsillectomy    . Control hemorrhage nasal      after nasal septal repair   Family History  Problem Relation Age of Onset  . Arthritis      family hx  . Colon cancer Neg Hx    History  Substance Use Topics  . Smoking status: Never Smoker   . Smokeless tobacco: Not on file   . Alcohol Use: No    Review of Systems  Constitutional: Negative for fever.  HENT: Negative for rhinorrhea, drooling and neck pain.   Eyes: Negative for pain.  Respiratory: Negative for cough and shortness of breath.   Cardiovascular: Negative for chest pain and leg swelling.  Gastrointestinal: Positive for nausea, abdominal pain and diarrhea (mild). Negative for vomiting.  Genitourinary: Positive for hematuria. Negative for dysuria.  Musculoskeletal: Negative for gait problem.  Skin: Negative for color change.  Neurological: Negative for numbness and headaches.  Hematological: Negative for adenopathy.  Psychiatric/Behavioral: Negative for behavioral problems.  All other systems reviewed and are negative.    Allergies  Review of patient's allergies indicates no known allergies.  Home Medications   Current Outpatient Rx  Name  Route  Sig  Dispense  Refill  . allopurinol (ZYLOPRIM) 300 MG tablet   Oral   Take 150 mg by mouth every morning.         . Black Currant Seed Oil (GAMMA-LINC 500) 500 MG CAPS   Oral   Take 1 capsule by mouth every morning.          . Boron 3 MG CAPS   Oral   Take 1 capsule by mouth every morning.          . Bromelains 500 MG TABS   Oral  Take 1 tablet by mouth every morning.          . cholecalciferol (VITAMIN D) 1000 UNITS tablet   Oral   Take 5,000 Units by mouth every morning.         . Choline Bitartrate 250 MG TABS   Oral   Take 1 tablet by mouth every morning.          . Cinnamon 500 MG capsule   Oral   Take 500 mg by mouth every morning.          . cyanocobalamin (,VITAMIN B-12,) 1000 MCG/ML injection   Intramuscular   Inject 1,000 mcg into the muscle every 30 (thirty) days.         Marland Kitchen DHEA 25 MG CAPS   Oral   Take 1 capsule by mouth every morning.          . fish oil-omega-3 fatty acids 1000 MG capsule   Oral   Take 1 g by mouth 2 (two) times daily.          . folic acid (FOLVITE) 800 MCG tablet    Oral   Take 200 mcg by mouth every morning.          . Ginger, Zingiber officinalis, (GINGER ROOT) 550 MG CAPS   Oral   Take 1 capsule by mouth every morning.          . Ginkgo Biloba 40 MG TABS   Oral   Take 1 tablet by mouth every morning.          . Lutein 20 MG TABS   Oral   Take 1 tablet by mouth every morning.          . magnesium oxide (MAG-OX) 400 MG tablet   Oral   Take 400 mg by mouth every morning.         . metoprolol succinate (TOPROL-XL) 50 MG 24 hr tablet   Oral   Take 25 mg by mouth 2 (two) times daily. Take with or immediately following a meal.         . metroNIDAZOLE (METROGEL) 1 % gel   Topical   Apply topically daily. 90 day supply with refills   180 g   11   . Misc Natural Products (SUPER OAT BRAN) 1000 MG TABS   Oral   Take 1 tablet by mouth every morning.          . multivitamin (THERAGRAN) per tablet   Oral   Take 1 tablet by mouth every morning.          . niacin (NIASPAN) 1000 MG CR tablet   Oral   Take 250 mg by mouth every morning.          . phosphorus (K PHOS NEUTRAL) 155-852-130 MG tablet   Oral   Take 1 tablet by mouth 2 (two) times daily.           Marland Kitchen RESVERATROL 100 MG CAPS   Oral   Take 1 capsule by mouth every morning.          . saw palmetto 160 MG capsule   Oral   Take 160 mg by mouth 2 (two) times daily.           Marland Kitchen selenium 50 MCG TABS   Oral   Take 50 mcg by mouth every morning.          . simvastatin (ZOCOR) 10 MG tablet   Oral   Take 1  tablet (10 mg total) by mouth at bedtime.   100 tablet   3   . Turmeric 500 MG CAPS   Oral   Take 1 capsule by mouth every morning.          . vitamin C (ASCORBIC ACID) 500 MG tablet   Oral   Take 250 mg by mouth every morning.         . warfarin (COUMADIN) 3 MG tablet   Oral   Take 3 mg by mouth daily.         Launa Grill Bark 500 MG CAPS   Oral   Take 1 capsule by mouth every morning.          . zinc sulfate 220 MG capsule   Oral    Take 220 mg by mouth every morning.          BP 166/121  Pulse 64  Temp(Src) 98.1 F (36.7 C) (Oral)  Resp 18  Ht 6\' 1"  (1.854 m)  Wt 198 lb (89.812 kg)  BMI 26.13 kg/m2  SpO2 97% Physical Exam  Nursing note and vitals reviewed. Constitutional: He is oriented to person, place, and time. He appears well-developed and well-nourished.  Nauseous on exam.   HENT:  Head: Normocephalic and atraumatic.  Right Ear: External ear normal.  Left Ear: External ear normal.  Nose: Nose normal.  Mouth/Throat: Oropharynx is clear and moist. No oropharyngeal exudate.  Eyes: Conjunctivae and EOM are normal. Pupils are equal, round, and reactive to light.  Neck: Normal range of motion. Neck supple.  Cardiovascular: Normal rate, regular rhythm, normal heart sounds and intact distal pulses.  Exam reveals no gallop and no friction rub.   No murmur heard. Pulmonary/Chest: Effort normal and breath sounds normal. No respiratory distress. He has no wheezes.  Abdominal: Soft. Bowel sounds are normal. He exhibits no distension. There is no tenderness. There is no rebound and no guarding.  No focal ttp of abdomen.   Musculoskeletal: Normal range of motion. He exhibits no edema and no tenderness.  Neurological: He is alert and oriented to person, place, and time.  Skin: Skin is warm. He is diaphoretic (mild).  Psychiatric: He has a normal mood and affect. His behavior is normal.    ED Course  Procedures (including critical care time) Labs Review Labs Reviewed  CBC WITH DIFFERENTIAL - Abnormal; Notable for the following:    RBC 4.14 (*)    HCT 38.6 (*)    Platelets 117 (*)    Neutrophils Relative % 81 (*)    All other components within normal limits  COMPREHENSIVE METABOLIC PANEL - Abnormal; Notable for the following:    Glucose, Bld 137 (*)    GFR calc non Af Amer 80 (*)    All other components within normal limits  PROTIME-INR - Abnormal; Notable for the following:    Prothrombin Time 22.9 (*)     INR 2.10 (*)    All other components within normal limits  URINALYSIS, ROUTINE W REFLEX MICROSCOPIC - Abnormal; Notable for the following:    Color, Urine RED (*)    APPearance CLOUDY (*)    Hgb urine dipstick LARGE (*)    Protein, ur 30 (*)    Leukocytes, UA SMALL (*)    All other components within normal limits  URINE CULTURE  URINE MICROSCOPIC-ADD ON   Imaging Review Ct Abdomen Pelvis Wo Contrast  09/30/2012   *RADIOLOGY REPORT*  Clinical Data: Left-sided flank pain  CT ABDOMEN AND  PELVIS WITHOUT CONTRAST  Technique:  Multidetector CT imaging of the abdomen and pelvis was performed following the standard protocol without intravenous contrast.  Comparison: CT 09/06/2011  Findings:  Renal:  Nonobstructing calculus in the mid left kidney.  There is a low density cyst in the left kidney.  There is mild hydro nephrosis and hydroureter on the left.  This is secondary to a partially obstructing calculus in the distal left ureter.  This calculus measures 3 mm  at the vesicoureteral junction.  No right nephrolithiasis or ureterolithiasis.  Lung bases are clear.  No focal hepatic lesion.  The gallbladder, pancreas, spleen, adrenal glands normal.  The stomach, small bowel, and cecum are normal.  The colon demonstrates several diverticula without acute inflammation.  No free fluid the pelvis.  The prostate gland is normal.  No pelvic lymphadenopathy abdominal lymphadenopathy.  Review of  bone windows demonstrates no aggressive osseous lesions.  IMPRESSION: Calculus in the distal left ureter at the vesicle ureteral junction with mild obstructive uropathy.   Original Report Authenticated By: Genevive Bi, M.D.    MDM   1. Nephrolithiasis    11:05 PM 79 y.o. male on Coumadin for a fib who presents with hematuria for 3 days and left flank pain which began 6 hours ago. The patient has a history of kidney stones and states that this pain is consistent with that. He is afebrile and vital signs are  unremarkable. Will treat symptomatically and get CT.  1:33 AM: Pt's pain controlled. CT showing 3mm kidney stone. No uti.  I have discussed the diagnosis/risks/treatment options with the patient and believe the pt to be eligible for discharge home to follow-up with his urologist next week as needed. We also discussed returning to the ED immediately if new or worsening sx occur. We discussed the sx which are most concerning (e.g., fever, vomiting, worsening pain) that necessitate immediate return. Any new prescriptions provided to the patient are listed below.  Discharge Medication List as of 10/01/2012  1:34 AM    START taking these medications   Details  oxyCODONE-acetaminophen (PERCOCET) 5-325 MG per tablet Take 1 tablet by mouth every 6 (six) hours as needed for pain., Starting 10/01/2012, Until Discontinued, Print    tamsulosin (FLOMAX) 0.4 MG CAPS capsule Take 1 capsule (0.4 mg total) by mouth daily., Starting 10/01/2012, Until Discontinued, Print         Junius Argyle, MD 10/01/12 [phone]

## 2012-10-01 DIAGNOSIS — N200 Calculus of kidney: Secondary | ICD-10-CM | POA: Diagnosis present

## 2012-10-01 LAB — CBC WITH DIFFERENTIAL/PLATELET
Basophils Absolute: 0 10*3/uL (ref 0.0–0.1)
Basophils Relative: 0 % (ref 0–1)
Eosinophils Absolute: 0.1 10*3/uL (ref 0.0–0.7)
MCH: 31.4 pg (ref 26.0–34.0)
MCHC: 33.7 g/dL (ref 30.0–36.0)
Monocytes Relative: 5 % (ref 3–12)
Neutro Abs: 5.6 10*3/uL (ref 1.7–7.7)
Neutrophils Relative %: 81 % — ABNORMAL HIGH (ref 43–77)
Platelets: 117 10*3/uL — ABNORMAL LOW (ref 150–400)
RDW: 14.5 % (ref 11.5–15.5)

## 2012-10-01 LAB — COMPREHENSIVE METABOLIC PANEL
AST: 29 U/L (ref 0–37)
Albumin: 4 g/dL (ref 3.5–5.2)
Alkaline Phosphatase: 74 U/L (ref 39–117)
BUN: 15 mg/dL (ref 6–23)
Chloride: 103 mEq/L (ref 96–112)
Potassium: 4 mEq/L (ref 3.5–5.1)
Sodium: 139 mEq/L (ref 135–145)
Total Protein: 6.5 g/dL (ref 6.0–8.3)

## 2012-10-01 LAB — URINALYSIS, ROUTINE W REFLEX MICROSCOPIC
Nitrite: NEGATIVE
Specific Gravity, Urine: 1.02 (ref 1.005–1.030)
Urobilinogen, UA: 0.2 mg/dL (ref 0.0–1.0)
pH: 6.5 (ref 5.0–8.0)

## 2012-10-01 LAB — PROTIME-INR: Prothrombin Time: 22.9 seconds — ABNORMAL HIGH (ref 11.6–15.2)

## 2012-10-01 MED ORDER — OXYCODONE-ACETAMINOPHEN 5-325 MG PO TABS: 1.0000 | ORAL_TABLET | Freq: Four times a day (QID) | ORAL | Status: DC | PRN

## 2012-10-01 MED ORDER — TAMSULOSIN HCL 0.4 MG PO CAPS: 0.4000 mg | ORAL_CAPSULE | Freq: Every day | ORAL | Status: DC

## 2012-10-02 LAB — URINE CULTURE
Colony Count: NO GROWTH
Culture: NO GROWTH

## 2012-10-24 ENCOUNTER — Other Ambulatory Visit (HOSPITAL_COMMUNITY): Payer: Self-pay | Admitting: Urology

## 2012-10-24 DIAGNOSIS — Q619 Cystic kidney disease, unspecified: Secondary | ICD-10-CM

## 2012-10-25 ENCOUNTER — Other Ambulatory Visit (HOSPITAL_COMMUNITY): Payer: Self-pay | Admitting: Urology

## 2012-10-25 DIAGNOSIS — N281 Cyst of kidney, acquired: Secondary | ICD-10-CM

## 2012-10-26 ENCOUNTER — Other Ambulatory Visit (HOSPITAL_COMMUNITY): Payer: Self-pay | Admitting: Urology

## 2012-10-26 DIAGNOSIS — N281 Cyst of kidney, acquired: Secondary | ICD-10-CM

## 2012-10-30 ENCOUNTER — Ambulatory Visit (INDEPENDENT_AMBULATORY_CARE_PROVIDER_SITE_OTHER): Payer: Medicare Other | Admitting: *Deleted

## 2012-10-30 ENCOUNTER — Encounter: Payer: Self-pay | Admitting: Cardiovascular Disease

## 2012-10-30 ENCOUNTER — Ambulatory Visit (INDEPENDENT_AMBULATORY_CARE_PROVIDER_SITE_OTHER): Payer: Medicare Other | Admitting: Cardiovascular Disease

## 2012-10-30 VITALS — BP 118/80 | HR 70 | Ht 73.0 in | Wt 206.0 lb

## 2012-10-30 DIAGNOSIS — E782 Mixed hyperlipidemia: Secondary | ICD-10-CM

## 2012-10-30 DIAGNOSIS — Z7901 Long term (current) use of anticoagulants: Secondary | ICD-10-CM

## 2012-10-30 DIAGNOSIS — I4891 Unspecified atrial fibrillation: Secondary | ICD-10-CM

## 2012-10-30 DIAGNOSIS — I10 Essential (primary) hypertension: Secondary | ICD-10-CM

## 2012-10-30 DIAGNOSIS — R0989 Other specified symptoms and signs involving the circulatory and respiratory systems: Secondary | ICD-10-CM | POA: Insufficient documentation

## 2012-10-30 LAB — POCT INR: INR: 2.1

## 2012-10-30 NOTE — Assessment & Plan Note (Signed)
Cholesterol is at goal.  Continue current dose of statin and diet Rx.  No myalgias or side effects.  F/U  LFT's in 6 months. Lab Results  Component Value Date   LDLCALC 95 05/16/2012

## 2012-10-30 NOTE — Assessment & Plan Note (Signed)
Not painful  F/U duplex to r/o AAA

## 2012-10-30 NOTE — Patient Instructions (Addendum)
Your physician has requested that you have an abdominal aorta duplex. During this test, an ultrasound is used to evaluate the aorta. Allow 30 minutes for this exam. Do not eat after midnight the day before and avoid carbonated beverages  Your physician wants you to follow-up in: 6 months  You will receive a reminder letter in the mail two months in advance. If you don't receive a letter, please call our office to schedule the follow-up appointment.  Your physician recommends that you continue on your current medications as directed. Please refer to the Current Medication list given to you today.

## 2012-10-30 NOTE — Progress Notes (Signed)
Patient ID: Chad Tucker, male   DOB: 25-Jul-1933, 79 y.o.   MRN: [id number] Chad Tucker is seen today in followup for anticoagulations atrial fibrillation and hyperlipidemia. He has no documented coronary artery disease. Previous echo EF 50-55% I reviewed his echo from today and EF is normal with mild biatrial enlargement. He has had no signs of congestive failure. He gets very infrequent palpitations. He has not had any chest pain PND orthopnea or lower extremity edema. His anticoagulation has been therapeutic without bleeding. He is on statin therapy with normal LFTs. In February his LDL was approximately 114. This is within target range for someone with no known coronary disease. Otherwise he's been doing well.  We discussed Pradaxa but he agreed to wait until an antidote for it is marketed  Has lost a lot of weight since I last saw him and looks great      ROS: Denies fever, malais, weight loss, blurry vision, decreased visual acuity, cough, sputum, SOB, hemoptysis, pleuritic pain, palpitaitons, heartburn, abdominal pain, melena, lower extremity edema, claudication, or rash.  All other systems reviewed and negative  General: Affect appropriate Healthy:  appears stated age HEENT: normal Neck supple with no adenopathy JVP normal no bruits no thyromegaly Lungs clear with no wheezing and good diaphragmatic motion Heart:  S1/S2 no murmur, no rub, gallop or click PMI normal Abdomen: benighn, BS positve, no tenderness, no AAA no bruit.  No HSM or HJR Distal pulses intact with no bruits No edema Neuro non-focal Skin warm and dry No muscular weakness   Current Outpatient Prescriptions  Medication Sig Dispense Refill  . allopurinol (ZYLOPRIM) 300 MG tablet Take 150 mg by mouth every morning.      . Black Currant Seed Oil (GAMMA-LINC 500) 500 MG CAPS Take 1 capsule by mouth every morning.       . Boron 3 MG CAPS Take 1 capsule by mouth every morning.       . Bromelains 500 MG TABS Take 1 tablet  by mouth every morning.       . cholecalciferol (VITAMIN D) 1000 UNITS tablet Take 5,000 Units by mouth every morning.      . Choline Bitartrate 250 MG TABS Take 1 tablet by mouth every morning.       . Cinnamon 500 MG capsule Take 500 mg by mouth every morning.       . cyanocobalamin (,VITAMIN B-12,) 1000 MCG/ML injection Inject 1,000 mcg into the muscle every 30 (thirty) days.      Marland Kitchen DHEA 25 MG CAPS Take 1 capsule by mouth every morning.       . fish oil-omega-3 fatty acids 1000 MG capsule Take 1 g by mouth 2 (two) times daily.       . folic acid (FOLVITE) 800 MCG tablet Take 200 mcg by mouth every morning.       . Ginger, Zingiber officinalis, (GINGER ROOT) 550 MG CAPS Take 1 capsule by mouth every morning.       . Ginkgo Biloba 40 MG TABS Take 1 tablet by mouth every morning.       . Lutein 20 MG TABS Take 1 tablet by mouth every morning.       . magnesium oxide (MAG-OX) 400 MG tablet Take 400 mg by mouth every morning.      . metoprolol succinate (TOPROL-XL) 50 MG 24 hr tablet Take 25 mg by mouth 2 (two) times daily. Take with or immediately following a meal.      .  metroNIDAZOLE (METROGEL) 1 % gel Apply topically daily. 90 day supply with refills  180 g  11  . Misc Natural Products (SUPER OAT BRAN) 1000 MG TABS Take 1 tablet by mouth every morning.       . multivitamin (THERAGRAN) per tablet Take 1 tablet by mouth every morning.       . niacin (NIASPAN) 1000 MG CR tablet Take 250 mg by mouth every morning.       Marland Kitchen oxyCODONE-acetaminophen (PERCOCET) 5-325 MG per tablet Take 1 tablet by mouth every 6 (six) hours as needed for pain.  15 tablet  0  . phosphorus (K PHOS NEUTRAL) 155-852-130 MG tablet Take 1 tablet by mouth 2 (two) times daily.        Marland Kitchen RESVERATROL 100 MG CAPS Take 1 capsule by mouth every morning.       . saw palmetto 160 MG capsule Take 160 mg by mouth 2 (two) times daily.        Marland Kitchen selenium 50 MCG TABS Take 50 mcg by mouth every morning.       . simvastatin (ZOCOR) 10 MG  tablet Take 1 tablet (10 mg total) by mouth at bedtime.  100 tablet  3  . tamsulosin (FLOMAX) 0.4 MG CAPS capsule Take 1 capsule (0.4 mg total) by mouth daily.  15 capsule  0  . Turmeric 500 MG CAPS Take 1 capsule by mouth every morning.       . vitamin C (ASCORBIC ACID) 500 MG tablet Take 250 mg by mouth every morning.      . warfarin (COUMADIN) 10 MG tablet Take 10 mg by mouth every evening.      Launa Grill Bark 500 MG CAPS Take 1 capsule by mouth every morning.       . zinc sulfate 220 MG capsule Take 220 mg by mouth every morning.       No current facility-administered medications for this visit.    Allergies  Review of patient's allergies indicates no known allergies.  Electrocardiogram:  05/16/12 Afib rate 86 otherwise normal   Assessment and Plan

## 2012-10-30 NOTE — Assessment & Plan Note (Signed)
Good rate control and anticoagulaiton  

## 2012-10-30 NOTE — Assessment & Plan Note (Signed)
Well controlled.  Continue current medications and low sodium Dash type diet.    

## 2012-11-07 ENCOUNTER — Ambulatory Visit (HOSPITAL_BASED_OUTPATIENT_CLINIC_OR_DEPARTMENT_OTHER): Payer: Medicare Other

## 2012-11-07 DIAGNOSIS — R0989 Other specified symptoms and signs involving the circulatory and respiratory systems: Secondary | ICD-10-CM

## 2012-11-08 ENCOUNTER — Ambulatory Visit (HOSPITAL_COMMUNITY)
Admission: RE | Admit: 2012-11-08 | Discharge: 2012-11-08 | Disposition: A | Discharge status: Home / Self Care | Payer: Medicare Other | Source: Ambulatory Visit | Attending: Urology | Admitting: Urology

## 2012-11-08 ENCOUNTER — Other Ambulatory Visit (HOSPITAL_COMMUNITY): Payer: Medicare Other

## 2012-11-08 ENCOUNTER — Other Ambulatory Visit: Payer: Self-pay | Admitting: Cardiovascular Disease

## 2012-11-08 DIAGNOSIS — N281 Cyst of kidney, acquired: Secondary | ICD-10-CM

## 2012-11-08 DIAGNOSIS — N400 Enlarged prostate without lower urinary tract symptoms: Secondary | ICD-10-CM | POA: Insufficient documentation

## 2012-12-15 ENCOUNTER — Ambulatory Visit (INDEPENDENT_AMBULATORY_CARE_PROVIDER_SITE_OTHER): Payer: Medicare Other | Admitting: General Practice

## 2012-12-15 DIAGNOSIS — Z7901 Long term (current) use of anticoagulants: Secondary | ICD-10-CM

## 2012-12-15 DIAGNOSIS — I4891 Unspecified atrial fibrillation: Secondary | ICD-10-CM

## 2012-12-15 LAB — POCT INR: INR: 2.8

## 2013-01-26 ENCOUNTER — Ambulatory Visit (INDEPENDENT_AMBULATORY_CARE_PROVIDER_SITE_OTHER): Payer: Medicare Other | Admitting: *Deleted

## 2013-01-26 DIAGNOSIS — I4891 Unspecified atrial fibrillation: Secondary | ICD-10-CM

## 2013-01-26 DIAGNOSIS — Z7901 Long term (current) use of anticoagulants: Secondary | ICD-10-CM

## 2013-01-26 LAB — POCT INR: INR: 2.2

## 2013-03-05 ENCOUNTER — Encounter: Payer: Self-pay | Admitting: Family Medicine

## 2013-03-05 ENCOUNTER — Ambulatory Visit (INDEPENDENT_AMBULATORY_CARE_PROVIDER_SITE_OTHER): Payer: Medicare Other | Admitting: Family Medicine

## 2013-03-05 VITALS — BP 118/80 | Temp 98.7°F | Wt 203.0 lb

## 2013-03-05 DIAGNOSIS — K4090 Unilateral inguinal hernia, without obstruction or gangrene, not specified as recurrent: Secondary | ICD-10-CM | POA: Insufficient documentation

## 2013-03-05 NOTE — Patient Instructions (Signed)
If the hernia increases in size, becomes painful, then we will get you set up for a general surgery consult from  Return when necessary  If you have the sudden sun so severe pain then go directly to the emergency room

## 2013-03-05 NOTE — Progress Notes (Signed)
   Subjective:    Patient ID: Chad Tucker, male    DOB: Feb 06, 1933, 80 y.o.   MRN: [id number]  HPI Chad Tucker is a 80 year old male nonsmoker who comes in today for evaluation of a mass in his left groin  Over the last couple weeks he's noticed an enlarging lump in his left groin. He said no fever chills nausea vomiting diarrhea or night sweats. No history of trauma  For exercise he walks no lifting   Review of Systems Review of systems negative    Objective:   Physical Exam Well-developed well-nourished male no acute distress vital signs stable is afebrile in the standing position there is a golf ball sized hernia in the left groin. It disappears when he lies down.       Assessment & Plan:  Left hernia plan observe surgical consult.

## 2013-03-09 ENCOUNTER — Ambulatory Visit (INDEPENDENT_AMBULATORY_CARE_PROVIDER_SITE_OTHER): Payer: Medicare Other | Admitting: *Deleted

## 2013-03-09 DIAGNOSIS — I4891 Unspecified atrial fibrillation: Secondary | ICD-10-CM

## 2013-03-09 DIAGNOSIS — Z7901 Long term (current) use of anticoagulants: Secondary | ICD-10-CM

## 2013-03-09 LAB — POCT INR: INR: 2.9

## 2013-03-19 ENCOUNTER — Encounter: Payer: Self-pay | Admitting: Family Medicine

## 2013-04-27 ENCOUNTER — Ambulatory Visit (INDEPENDENT_AMBULATORY_CARE_PROVIDER_SITE_OTHER): Payer: Medicare Other | Admitting: *Deleted

## 2013-04-27 DIAGNOSIS — I4891 Unspecified atrial fibrillation: Secondary | ICD-10-CM

## 2013-04-27 DIAGNOSIS — Z7901 Long term (current) use of anticoagulants: Secondary | ICD-10-CM

## 2013-04-27 DIAGNOSIS — Z5181 Encounter for therapeutic drug level monitoring: Secondary | ICD-10-CM | POA: Insufficient documentation

## 2013-04-27 LAB — POCT INR: INR: 2.2

## 2013-04-28 ENCOUNTER — Other Ambulatory Visit: Payer: Self-pay | Admitting: Cardiovascular Disease

## 2013-05-22 ENCOUNTER — Ambulatory Visit (INDEPENDENT_AMBULATORY_CARE_PROVIDER_SITE_OTHER): Payer: Medicare Other | Admitting: Family Medicine

## 2013-05-22 ENCOUNTER — Encounter: Payer: Self-pay | Admitting: Family Medicine

## 2013-05-22 VITALS — BP 120/84 | Temp 98.3°F | Ht 71.5 in | Wt 203.0 lb

## 2013-05-22 DIAGNOSIS — Z23 Encounter for immunization: Secondary | ICD-10-CM

## 2013-05-22 DIAGNOSIS — D510 Vitamin B12 deficiency anemia due to intrinsic factor deficiency: Secondary | ICD-10-CM

## 2013-05-22 DIAGNOSIS — N529 Male erectile dysfunction, unspecified: Secondary | ICD-10-CM

## 2013-05-22 DIAGNOSIS — N401 Enlarged prostate with lower urinary tract symptoms: Secondary | ICD-10-CM

## 2013-05-22 DIAGNOSIS — R413 Other amnesia: Secondary | ICD-10-CM

## 2013-05-22 DIAGNOSIS — E782 Mixed hyperlipidemia: Secondary | ICD-10-CM

## 2013-05-22 DIAGNOSIS — N138 Other obstructive and reflux uropathy: Secondary | ICD-10-CM

## 2013-05-22 DIAGNOSIS — M109 Gout, unspecified: Secondary | ICD-10-CM

## 2013-05-22 DIAGNOSIS — I4891 Unspecified atrial fibrillation: Secondary | ICD-10-CM

## 2013-05-22 DIAGNOSIS — R351 Nocturia: Secondary | ICD-10-CM

## 2013-05-22 DIAGNOSIS — I10 Essential (primary) hypertension: Secondary | ICD-10-CM

## 2013-05-22 LAB — CBC WITH DIFFERENTIAL/PLATELET
BASOS PCT: 0.7 % (ref 0.0–3.0)
Basophils Absolute: 0 10*3/uL (ref 0.0–0.1)
Eosinophils Absolute: 0.1 10*3/uL (ref 0.0–0.7)
Eosinophils Relative: 1.2 % (ref 0.0–5.0)
HCT: 41.9 % (ref 39.0–52.0)
HEMOGLOBIN: 13.9 g/dL (ref 13.0–17.0)
LYMPHS PCT: 19.8 % (ref 12.0–46.0)
Lymphs Abs: 0.9 10*3/uL (ref 0.7–4.0)
MCHC: 33.3 g/dL (ref 30.0–36.0)
MCV: 94.8 fl (ref 78.0–100.0)
MONOS PCT: 8.4 % (ref 3.0–12.0)
Monocytes Absolute: 0.4 10*3/uL (ref 0.1–1.0)
NEUTROS ABS: 3.3 10*3/uL (ref 1.4–7.7)
NEUTROS PCT: 69.9 % (ref 43.0–77.0)
Platelets: 144 10*3/uL — ABNORMAL LOW (ref 150.0–400.0)
RBC: 4.42 Mil/uL (ref 4.22–5.81)
RDW: 15.7 % — ABNORMAL HIGH (ref 11.5–14.6)
WBC: 4.8 10*3/uL (ref 4.5–10.5)

## 2013-05-22 LAB — BASIC METABOLIC PANEL
BUN: 13 mg/dL (ref 6–23)
CALCIUM: 9.4 mg/dL (ref 8.4–10.5)
CO2: 29 meq/L (ref 19–32)
Chloride: 102 mEq/L (ref 96–112)
Creatinine, Ser: 0.9 mg/dL (ref 0.4–1.5)
GFR: 92.59 mL/min (ref >60.00)
GLUCOSE: 86 mg/dL (ref 70–99)
POTASSIUM: 4 meq/L (ref 3.5–5.1)
SODIUM: 139 meq/L (ref 135–145)

## 2013-05-22 LAB — LIPID PANEL
CHOL/HDL RATIO: 3
Cholesterol: 196 mg/dL (ref 0–200)
HDL: 74.5 mg/dL (ref >39.00)
LDL CALC: 110 mg/dL — AB (ref 0–99)
TRIGLYCERIDES: 59 mg/dL (ref 0.0–149.0)
VLDL: 11.8 mg/dL (ref 0.0–40.0)

## 2013-05-22 LAB — POCT URINALYSIS DIPSTICK
GLUCOSE UA: NEGATIVE
Leukocytes, UA: NEGATIVE
NITRITE UA: NEGATIVE
SPEC GRAV UA: 1.025
UROBILINOGEN UA: 0.2
pH, UA: 6

## 2013-05-22 LAB — VITAMIN B12: Vitamin B-12: 869 pg/mL (ref 211–911)

## 2013-05-22 LAB — HEPATIC FUNCTION PANEL
ALBUMIN: 4.3 g/dL (ref 3.5–5.2)
ALT: 22 U/L (ref 0–53)
AST: 34 U/L (ref 0–37)
Alkaline Phosphatase: 59 U/L (ref 39–117)
Bilirubin, Direct: 0.2 mg/dL (ref 0.0–0.3)
TOTAL PROTEIN: 6.3 g/dL (ref 6.0–8.3)
Total Bilirubin: 1.1 mg/dL (ref 0.3–1.2)

## 2013-05-22 LAB — TSH: TSH: 1.65 u[IU]/mL (ref 0.35–5.50)

## 2013-05-22 LAB — PSA: PSA: 2.58 ng/mL (ref 0.10–4.00)

## 2013-05-22 MED ORDER — SIMVASTATIN 10 MG PO TABS: 10.0000 mg | ORAL_TABLET | Freq: Every day | ORAL | Status: AC

## 2013-05-22 MED ORDER — ALLOPURINOL 300 MG PO TABS: 150.0000 mg | ORAL_TABLET | Freq: Every morning | ORAL | Status: DC

## 2013-05-22 MED ORDER — METOPROLOL SUCCINATE ER 50 MG PO TB24: ORAL_TABLET | ORAL | Status: AC

## 2013-05-22 NOTE — Progress Notes (Signed)
Pre visit review using our clinic review tool, if applicable. No additional management support is needed unless otherwise documented below in the visit note. 

## 2013-05-22 NOTE — Patient Instructions (Signed)
Continue your current medications  Return in one year for general Medicare wellness examination sooner if any problems

## 2013-05-22 NOTE — Progress Notes (Signed)
   Subjective:    Patient ID: Chad Tucker, male    DOB: 06/10/1933, 80 y.o.   MRN: [id number]  HPI Chad Tucker is a 80 year old married male nonsmoker who comes in today for a Medicare wellness examination because of history of gout, rosacea,,,,,,,,,, which is now resolved spontaneously,,,,,,, hypertension on beta blocker 50 mg twice a day BP 120/84, hyperlipidemia on Zocor 10 mg daily,,,,,,, check labs today  He's also on Coumadin because of chronic atrial fib. This is followed in cardiology.  He was on Flomax for BPH he stopped that. Said he is urinating fairly well  Vaccinations up-to-date except he needs a Pneumovax  Cognitive function normal he exercises on a regular basis home health safety reviewed no issues identified, no guns in the house, he does have a health care power of attorney and living well  He gets routine eye care, dental care, colonoscopy and GI,   Review of Systems  Constitutional: Negative.   HENT: Negative.   Eyes: Negative.   Respiratory: Negative.   Cardiovascular: Negative.   Gastrointestinal: Negative.   Genitourinary: Negative.   Musculoskeletal: Negative.   Skin: Negative.   Neurological: Negative.   Psychiatric/Behavioral: Negative.    He says at the end of the interview he's having difficulty retrieving names .  Marland Kitchen. He's concerned because he does have a family history of Alzheimer's disease    Objective:   Physical Exam  Nursing note and vitals reviewed. Constitutional: He is oriented to person, place, and time. He appears well-developed and well-nourished.  HENT:  Head: Normocephalic and atraumatic.  Right Ear: External ear normal.  Left Ear: External ear normal.  Nose: Nose normal.  Mouth/Throat: Oropharynx is clear and moist.  Eyes: Conjunctivae and EOM are normal. Pupils are equal, round, and reactive to light.  Neck: Normal range of motion. Neck supple. No JVD present. No tracheal deviation present. No thyromegaly present.  Cardiovascular:  Normal rate, regular rhythm, normal heart sounds and intact distal pulses.  Exam reveals no gallop and no friction rub.   No murmur heard. No carotid neuritic bruits distal pulses 2+ and symmetrical  Pulmonary/Chest: Effort normal and breath sounds normal. No stridor. No respiratory distress. He has no wheezes. He has no rales. He exhibits no tenderness.  Abdominal: Soft. Bowel sounds are normal. He exhibits no distension and no mass. There is no tenderness. There is no rebound and no guarding.  Genitourinary:  Rectal exam done by urologist therefore not repeated  Musculoskeletal: Normal range of motion. He exhibits no edema and no tenderness.  Lymphadenopathy:    He has no cervical adenopathy.  Neurological: He is alert and oriented to person, place, and time. He has normal reflexes. No cranial nerve deficit. He exhibits normal muscle tone.  Skin: Skin is warm and dry. No rash noted. No erythema. No pallor.  Psychiatric: He has a normal mood and affect. His behavior is normal. Judgment and thought content normal.          Assessment & Plan:  Healthy male  History of gout continue allopurinol  Hypertension continue l beta blocker 25 mg twice a day  Hyperlipidemia continue Zocor 10 mg daily  History of BPH off his Flomax being fairly normally followed by urology  New problem of short-term memory loss....... neurologic consult

## 2013-05-23 ENCOUNTER — Telehealth: Payer: Self-pay | Admitting: Family Medicine

## 2013-05-23 NOTE — Telephone Encounter (Signed)
Relevant patient education mailed to patient.  

## 2013-06-06 ENCOUNTER — Ambulatory Visit (INDEPENDENT_AMBULATORY_CARE_PROVIDER_SITE_OTHER): Payer: Medicare Other | Admitting: Neurology

## 2013-06-06 ENCOUNTER — Encounter: Payer: Self-pay | Admitting: Neurology

## 2013-06-06 VITALS — BP 132/82 | HR 72 | Resp 16 | Ht 71.0 in | Wt 206.0 lb

## 2013-06-06 DIAGNOSIS — G3184 Mild cognitive impairment, so stated: Secondary | ICD-10-CM

## 2013-06-06 DIAGNOSIS — F809 Developmental disorder of speech and language, unspecified: Secondary | ICD-10-CM

## 2013-06-06 DIAGNOSIS — R413 Other amnesia: Secondary | ICD-10-CM

## 2013-06-06 DIAGNOSIS — R4789 Other speech disturbances: Secondary | ICD-10-CM

## 2013-06-06 MED ORDER — DONEPEZIL HCL 5 MG PO TABS
5.0000 mg | ORAL_TABLET | Freq: Every day | ORAL | Status: DC
Start: 2013-06-06 — End: 2013-12-07

## 2013-06-06 NOTE — Progress Notes (Signed)
NEUROLOGY CONSULTATION NOTE  Chad Tucker MRN: [id number] DOB: 1934-01-04  Referring provider: Dr. Tawanna Coolerodd Primary care provider: Dr. Tawanna Coolerodd  Reason for consult:  Memory and word-finding problems  HISTORY OF PRESENT ILLNESS: Chad AliasWilliam Tucker is a 80 year old right-handed man with history of pernicious anemia, gout, hypertension, atrial fibrillation (on Coumadin), hyperlipidemia and BPH who presents for memory changes.  Records and images were personally reviewed where available.    His daughter reportedly noted some cognitive problems back in 2006, but the patient hasn't noticed any symptoms until around 5 years ago.  On 08/25/04, he had a baseline neurological evaluation [facility].  His Mini-Mental Status exam score was 29/30, having only missed one word with delayed recall. No cognitive impairment was appreciated at that time.  Over the past 5 years, he has noticed a gradual progression of word finding difficulties. He needs to take a little time to think of words such as names of medication or names of familiar people. For example, it took him several seconds to recall the last name of one of his friends that he has known for many years. When he tries to recall somebody's name, he often has to picture there face, there house, or their car before it comes to him. Sometimes when his wife says something to him and he is stuck on a word, he has problems visualizing things associated with that word. When he is reading, he has no problems. He finds that he doesn't repeat questions often. He doesn't believe that he forgets conversations. He and his wife both take part in managing finances in pain the bills. Maybe about one time a year he may be late in pain a couple of pills. Otherwise, he has no problems with this. He still drives. He feels comfortable driving and is not disoriented. He has not had any moving vehicle accidents. He is not aware of any changes in personality or behavior  and family members have not brought this up to him. He does not experience hallucinations or delusions. For exercise, he walks 3 miles 3-4 days a week with a neighbor. One day of the week, he walks [place] [employer] [place]. He enjoys reading but does find that he will forget what he reads after a little while. He doesn't typically socialize much but he does go to church and as mentioned, goes on walks with a neighbor. He denies depression. He has no history of strokes or TIAs. He is a retired Agricultural consultantengineer and lawyer. Both his mother and maternal grandmother had Alzheimer's disease. His paternal grandfather had a history of TIAs and possibly vascular dementia.  05/22/13 LABS:  TSH 1.65, B12 869, WBC 4.8, HGB 13.9, HCT 41.9, PLT 144, Na 139, K 4, Cl 102, bicarb 29, glucose 86, BUN 13, Cr 0.9, TB 1.1, DB 0.2, AP 59, AST 34, ALT 22, TP 6.3, ALB 4.3  PAST MEDICAL HISTORY: Past Medical History  Diagnosis Date  . Essential hypertension, benign   . Atrial fibrillation   . HLD (hyperlipidemia)     mixed  . Long term (current) use of anticoagulants     coumadin therapy   . Pure hypercholesterolemia   . Dyspnea   . DDD (degenerative disc disease), cervical     w/radioculopathy  . Rosacea   . ED (erectile dysfunction) of organic origin   . Heart failure     congestive. secondary to afib   . NICM (nonischemic cardiomyopathy)     EF of  30%  . Renal disorder     kidney stones    PAST SURGICAL HISTORY: Past Surgical History  Procedure Laterality Date  . Appendectomy    . Tonsillectomy    . Control hemorrhage nasal      after nasal septal repair    MEDICATIONS: Current Outpatient Prescriptions on File Prior to Visit  Medication Sig Dispense Refill  . allopurinol (ZYLOPRIM) 300 MG tablet Take 0.5 tablets (150 mg total) by mouth every morning.  100 tablet  3  . Black Currant Seed Oil (GAMMA-LINC 500) 500 MG CAPS Take 1 capsule by mouth every morning.       . Boron 3 MG CAPS Take 1 capsule by  mouth every morning.       . Bromelains 500 MG TABS Take 1 tablet by mouth every morning.       . cholecalciferol (VITAMIN D) 1000 UNITS tablet Take 5,000 Units by mouth every other day.       . Cinnamon 500 MG capsule Take 500 mg by mouth every morning.       . cyanocobalamin (,VITAMIN B-12,) 1000 MCG/ML injection Inject 1,000 mcg into the muscle every 30 (thirty) days.      Marland Kitchen DHEA 25 MG CAPS Take 1 capsule by mouth every morning.       . fish oil-omega-3 fatty acids 1000 MG capsule Take 1 g by mouth 3 (three) times daily.       . folic acid (FOLVITE) 800 MCG tablet Take 400 mcg by mouth daily.       . Ginger, Zingiber officinalis, (GINGER ROOT) 550 MG CAPS Take 1 capsule by mouth daily.       . Ginkgo Biloba 40 MG TABS Take 1 tablet by mouth daily.       . Lutein 20 MG TABS Take 1 tablet by mouth daily.       . magnesium oxide (MAG-OX) 400 MG tablet Take 400 mg by mouth daily.       . metoprolol succinate (TOPROL-XL) 50 MG 24 hr tablet One half tab twice a day  100 tablet  3  . metroNIDAZOLE (METROGEL) 1 % gel Apply topically daily. 90 day supply with refills  180 g  11  . Misc Natural Products (SUPER OAT BRAN) 1000 MG TABS Take 1 tablet by mouth every morning.       . multivitamin (THERAGRAN) per tablet Take 1 tablet by mouth daily.       . niacin (NIASPAN) 1000 MG CR tablet Take 250 mg by mouth every morning.       . phosphorus (K PHOS NEUTRAL) 155-852-130 MG tablet Take 1 tablet by mouth 2 (two) times daily.        Marland Kitchen RESVERATROL 100 MG CAPS Take 1 capsule by mouth daily.       . saw palmetto 160 MG capsule Take 160 mg by mouth 3 (three) times daily.       Marland Kitchen selenium 50 MCG TABS Take 50 mcg by mouth daily.       . simvastatin (ZOCOR) 10 MG tablet Take 1 tablet (10 mg total) by mouth at bedtime.  100 tablet  3  . Turmeric 500 MG CAPS Take 1 capsule by mouth every evening.       . vitamin C (ASCORBIC ACID) 500 MG tablet Take 250 mg by mouth every morning.      . warfarin (COUMADIN) 3 MG  tablet 4.5mg  daily or as directed by coumadin clinic  150 tablet  1  . Yohimbe Bark 500 MG CAPS Take 1 capsule by mouth daily.       . Zinc 50 MG CAPS Take 1 capsule by mouth daily.       No current facility-administered medications on file prior to visit.    ALLERGIES: No Known Allergies  FAMILY HISTORY: Family History  Problem Relation Age of Onset  . Arthritis      family hx  . Colon cancer Neg Hx     SOCIAL HISTORY: History   Social History  . Marital Status: Married    Spouse Name: N/A    Number of Children: N/A  . Years of Education: N/A   Occupational History  . Not on file.   Social History Main Topics  . Smoking status: Never Smoker   . Smokeless tobacco: Not on file  . Alcohol Use: No  . Drug Use: No  . Sexual Activity: Not on file   Other Topics Concern  . Not on file   Social History Narrative   Married, retired, gets regular exercise.     REVIEW OF SYSTEMS: Constitutional: No fevers, chills, or sweats, no generalized fatigue, change in appetite Eyes: No visual changes, double vision, eye pain Ear, nose and throat: No hearing loss, ear pain, nasal congestion, sore throat Cardiovascular: No chest pain, palpitations Respiratory:  No shortness of breath at rest or with exertion, wheezes GastrointestinaI: No nausea, vomiting, diarrhea, abdominal pain, fecal incontinence Genitourinary:  No dysuria, urinary retention or frequency Musculoskeletal:  No neck pain, back pain Integumentary: No rash, pruritus, skin lesions Neurological: as above Psychiatric: No depression, insomnia, anxiety Endocrine: No palpitations, fatigue, diaphoresis, mood swings, change in appetite, change in weight, increased thirst Hematologic/Lymphatic:  No anemia, purpura, petechiae. Allergic/Immunologic: no itchy/runny eyes, nasal congestion, recent allergic reactions, rashes  PHYSICAL EXAM: Filed Vitals:   06/06/13 0837  BP: 132/82  Pulse: 72  Resp: 16   General: No acute  distress Head:  Normocephalic/atraumatic Neck: supple, no paraspinal tenderness, full range of motion Back: No paraspinal tenderness Heart: regular rate and rhythm Lungs: Clear to auscultation bilaterally. Vascular: No carotid bruits. Neurological Exam: Mental status: alert and oriented to person, place, and time, remote memory intact, fund of knowledge intact, attention and concentration intact, speech fluent and not dysarthric, naming fairly good but was unable to recall the name for rhinoceros.  Otherwise language intact. Serial 7 subtraction intact. Naming fluency intact. Abstraction intact. Visual spatial and executive functioning intact, such as trail making test, copying a cube, and drawing a clock. `Recalled 2/5 words after 5 minutes. MOCA score 26/30. Cranial nerves: CN I: not tested CN II: pupils equal, round and reactive to light, visual fields intact, fundi unremarkable, without vessel changes, exudates, hemorrhages or papilledema. CN III, IV, VI:  full range of motion, no nystagmus, no ptosis CN V: facial sensation intact CN VII: upper and lower face symmetric CN VIII: hearing intact CN IX, X: gag intact, uvula midline CN XI: sternocleidomastoid and trapezius muscles intact CN XII: tongue midline Bulk & Tone: normal, no fasciculations. Motor: 5 out of 5 throughout. Very fine tremor when writing with a pen. Sensation: Temperature and vibration intact Deep Tendon Reflexes: 2+ throughout, except absent at the ankles, toes downgoing Finger to nose testing: No dysmetria Gait: Normal station and stride. Able to turn and walk in tandem. Romberg negative.  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  PLAN: 1.  We will start Aricept 5 mg at  bedtime for 4 weeks. If tolerated, will increase to 10 mg at bedtime. 2. MRI of the brain. 3. Followup in 6 months for reevaluation. Consider neuropsychological testing.  Thank you for allowing me to take part  in the care of this patient.  Shon Millet, DO  CC: Kelle Darting, MD

## 2013-06-06 NOTE — Patient Instructions (Addendum)
1.  We will start donepezil (Aricept) 5mg  daily for four weeks.  If you are tolerating the medication, then after four weeks, we will increase the dose to 10mg  daily at bedtime.  Side effects include nausea, vomiting, diarrhea, vivid dreams, and muscle cramps.  Please call the clinic if you experience any of these symptoms.  2.  We will get MRI of the brain.May 18 at 12: 45 pm [facility]  3.  Follow up in 6 months. 4.  Consider neuropsychological testing.

## 2013-06-08 ENCOUNTER — Ambulatory Visit (INDEPENDENT_AMBULATORY_CARE_PROVIDER_SITE_OTHER): Payer: Medicare Other | Admitting: Pharmacist

## 2013-06-08 DIAGNOSIS — Z7901 Long term (current) use of anticoagulants: Secondary | ICD-10-CM

## 2013-06-08 DIAGNOSIS — Z5181 Encounter for therapeutic drug level monitoring: Secondary | ICD-10-CM

## 2013-06-08 DIAGNOSIS — I4891 Unspecified atrial fibrillation: Secondary | ICD-10-CM

## 2013-06-08 LAB — POCT INR: INR: 2.9

## 2013-06-11 ENCOUNTER — Ambulatory Visit (HOSPITAL_COMMUNITY)
Admission: RE | Admit: 2013-06-11 | Discharge: 2013-06-11 | Disposition: A | Discharge status: Home / Self Care | Payer: Medicare Other | Source: Ambulatory Visit | Attending: Neurology | Admitting: Neurology

## 2013-06-11 DIAGNOSIS — R413 Other amnesia: Secondary | ICD-10-CM | POA: Insufficient documentation

## 2013-06-11 DIAGNOSIS — R4789 Other speech disturbances: Secondary | ICD-10-CM

## 2013-06-11 DIAGNOSIS — I10 Essential (primary) hypertension: Secondary | ICD-10-CM | POA: Insufficient documentation

## 2013-06-12 ENCOUNTER — Telehealth: Payer: Self-pay | Admitting: *Deleted

## 2013-06-12 NOTE — Telephone Encounter (Signed)
Patient is aware of MRI report

## 2013-06-27 ENCOUNTER — Telehealth: Payer: Self-pay | Admitting: Family Medicine

## 2013-06-27 DIAGNOSIS — M109 Gout, unspecified: Secondary | ICD-10-CM

## 2013-06-27 NOTE — Telephone Encounter (Signed)
Pt has question concerning allopurnol 300 mg. Pt direction are take 0.5mg (150 mg) in morning of 300 mg. Pt stated he was taking 1 pill 300 mg

## 2013-06-28 MED ORDER — ALLOPURINOL 300 MG PO TABS: 300.0000 mg | ORAL_TABLET | Freq: Every day | ORAL | Status: AC

## 2013-06-28 NOTE — Telephone Encounter (Signed)
Spoke with patient and new Rx sent

## 2013-07-20 ENCOUNTER — Ambulatory Visit (INDEPENDENT_AMBULATORY_CARE_PROVIDER_SITE_OTHER): Payer: Medicare Other | Admitting: Pharmacist

## 2013-07-20 DIAGNOSIS — Z5181 Encounter for therapeutic drug level monitoring: Secondary | ICD-10-CM

## 2013-07-20 DIAGNOSIS — I4891 Unspecified atrial fibrillation: Secondary | ICD-10-CM

## 2013-07-20 DIAGNOSIS — Z7901 Long term (current) use of anticoagulants: Secondary | ICD-10-CM

## 2013-07-20 LAB — POCT INR: INR: 4.3

## 2013-08-03 ENCOUNTER — Ambulatory Visit (INDEPENDENT_AMBULATORY_CARE_PROVIDER_SITE_OTHER): Payer: Medicare Other | Admitting: Pharmacist

## 2013-08-03 DIAGNOSIS — Z5181 Encounter for therapeutic drug level monitoring: Secondary | ICD-10-CM

## 2013-08-03 DIAGNOSIS — I4891 Unspecified atrial fibrillation: Secondary | ICD-10-CM

## 2013-08-03 DIAGNOSIS — Z7901 Long term (current) use of anticoagulants: Secondary | ICD-10-CM

## 2013-08-03 LAB — POCT INR: INR: 2.6

## 2013-08-31 ENCOUNTER — Ambulatory Visit (INDEPENDENT_AMBULATORY_CARE_PROVIDER_SITE_OTHER): Payer: Medicare Other | Admitting: Pharmacist

## 2013-08-31 DIAGNOSIS — I4891 Unspecified atrial fibrillation: Secondary | ICD-10-CM

## 2013-08-31 DIAGNOSIS — Z7901 Long term (current) use of anticoagulants: Secondary | ICD-10-CM

## 2013-08-31 DIAGNOSIS — Z5181 Encounter for therapeutic drug level monitoring: Secondary | ICD-10-CM

## 2013-08-31 LAB — POCT INR: INR: 2.9

## 2013-10-25 ENCOUNTER — Ambulatory Visit (INDEPENDENT_AMBULATORY_CARE_PROVIDER_SITE_OTHER): Payer: Medicare Other

## 2013-10-25 DIAGNOSIS — Z5181 Encounter for therapeutic drug level monitoring: Secondary | ICD-10-CM

## 2013-10-25 DIAGNOSIS — Z7901 Long term (current) use of anticoagulants: Secondary | ICD-10-CM

## 2013-10-25 DIAGNOSIS — I4891 Unspecified atrial fibrillation: Secondary | ICD-10-CM

## 2013-10-25 LAB — POCT INR: INR: 1.9

## 2013-11-08 ENCOUNTER — Ambulatory Visit (INDEPENDENT_AMBULATORY_CARE_PROVIDER_SITE_OTHER): Payer: Medicare Other

## 2013-11-08 DIAGNOSIS — Z23 Encounter for immunization: Secondary | ICD-10-CM

## 2013-11-29 ENCOUNTER — Other Ambulatory Visit: Payer: Self-pay | Admitting: *Deleted

## 2013-11-29 MED ORDER — WARFARIN SODIUM 3 MG PO TABS: ORAL_TABLET | ORAL | Status: DC

## 2013-12-05 ENCOUNTER — Ambulatory Visit (INDEPENDENT_AMBULATORY_CARE_PROVIDER_SITE_OTHER): Payer: Medicare Other | Admitting: *Deleted

## 2013-12-05 DIAGNOSIS — Z7901 Long term (current) use of anticoagulants: Secondary | ICD-10-CM

## 2013-12-05 DIAGNOSIS — Z5181 Encounter for therapeutic drug level monitoring: Secondary | ICD-10-CM

## 2013-12-05 DIAGNOSIS — I4891 Unspecified atrial fibrillation: Secondary | ICD-10-CM

## 2013-12-05 LAB — POCT INR: INR: 2.5

## 2013-12-07 ENCOUNTER — Encounter: Payer: Self-pay | Admitting: Neurology

## 2013-12-07 ENCOUNTER — Ambulatory Visit (INDEPENDENT_AMBULATORY_CARE_PROVIDER_SITE_OTHER): Payer: Medicare Other | Admitting: Neurology

## 2013-12-07 VITALS — BP 118/70 | HR 74 | Temp 97.7°F | Resp 16 | Ht 73.0 in | Wt 200.7 lb

## 2013-12-07 DIAGNOSIS — F0280 Dementia in other diseases classified elsewhere without behavioral disturbance: Secondary | ICD-10-CM

## 2013-12-07 DIAGNOSIS — G309 Alzheimer's disease, unspecified: Secondary | ICD-10-CM

## 2013-12-07 MED ORDER — DONEPEZIL HCL 10 MG PO TABS: 10.0000 mg | ORAL_TABLET | Freq: Every day | ORAL | Status: DC

## 2013-12-07 NOTE — Progress Notes (Signed)
NEUROLOGY FOLLOW UP OFFICE NOTE  Chad Tucker [id number]  HISTORY OF PRESENT ILLNESS: Chad Tucker is a 80 year old right-handed man with history of pernicious anemia, gout, hypertension, atrial fibrillation (on Coumadin), hyperlipidemia and BPH who follows up for mild cognitive impairment of the primarily amnestic type.  MRI reviewed.  UPDATE: He was started on Aricept but never had it refilled, because he didn't know he was supposed to continue taking it.  He feels there has been no changes.  He is troubled by word-finding difficulties. MRI of the brain without contrast was performed on 06/11/13, which showed generalized cerebral and cerebellar atrophy with small vessel ischemic changes.    HISTORY: Over 5 years, he has noticed a gradual progression of word finding difficulties, however his daughter first noticed problems in 2006. He needs to take a little time to think of words such as names of medication or names of familiar people. For example, it took him several seconds to recall the last name of one of his friends that he has known for many years. When he tries to recall somebody's name, he often has to picture there face, there house, or their car before it comes to him. Sometimes when his wife says something to him and he is stuck on a word, he has problems visualizing things associated with that word. When he is reading, he has no problems. He finds that he doesn't repeat questions often. He doesn't believe that he forgets conversations. He and his wife both take part in managing finances in pain the bills. Maybe about one time a year he may be late in pain a couple of pills. Otherwise, he has no problems with this. He still drives. He feels comfortable driving and is not disoriented. He has not had any moving vehicle accidents. He is not aware of any changes in personality or behavior and family members have not brought this up to him. He does not experience hallucinations or  delusions. For exercise, he walks 3 miles 3-4 days a week with a neighbor. One day of the week, he walks with a [employer] [place]. He enjoys reading but does find that he will forget what he reads after a little while. He doesn't typically socialize much but he does go to church and as mentioned, goes on walks with a neighbor. He denies depression. He has no history of strokes or TIAs. He is a retired Agricultural consultantengineer and lawyer. Both his mother and maternal grandmother had Alzheimer's disease. His paternal grandfather had a history of TIAs and possibly vascular dementia.  05/22/13 LABS:  TSH 1.65, B12 869 06/06/13 MOCA 26/30  PAST MEDICAL HISTORY: Past Medical History  Diagnosis Date  . Essential hypertension, benign   . Atrial fibrillation   . HLD (hyperlipidemia)     mixed  . Long term (current) use of anticoagulants     coumadin therapy   . Pure hypercholesterolemia   . Dyspnea   . DDD (degenerative disc disease), cervical     w/radioculopathy  . Rosacea   . ED (erectile dysfunction) of organic origin   . Heart failure     congestive. secondary to afib   . NICM (nonischemic cardiomyopathy)     EF of 30%  . Renal disorder     kidney stones    MEDICATIONS: Current Outpatient Prescriptions on File Prior to Visit  Medication Sig Dispense Refill  . allopurinol (ZYLOPRIM) 300 MG tablet Take 1 tablet (300 mg total) by mouth daily.  100 tablet 3  . Black Currant Seed Oil (GAMMA-LINC 500) 500 MG CAPS Take 1 capsule by mouth every morning.     . Boron 3 MG CAPS Take 1 capsule by mouth every morning.     . Bromelains 500 MG TABS Take 1 tablet by mouth every morning.     . cholecalciferol (VITAMIN D) 1000 UNITS tablet Take 5,000 Units by mouth every other day.     . Cinnamon 500 MG capsule Take 500 mg by mouth every morning.     . cyanocobalamin (,VITAMIN B-12,) 1000 MCG/ML injection Inject 1,000 mcg into the muscle every 30 (thirty) days.    Marland Kitchen DHEA 25 MG CAPS Take 1 capsule by mouth every  morning.     . fish oil-omega-3 fatty acids 1000 MG capsule Take 1 g by mouth 3 (three) times daily.     . folic acid (FOLVITE) 800 MCG tablet Take 400 mcg by mouth daily.     . Ginger, Zingiber officinalis, (GINGER ROOT) 550 MG CAPS Take 1 capsule by mouth daily.     . Lutein 20 MG TABS Take 1 tablet by mouth daily.     . magnesium oxide (MAG-OX) 400 MG tablet Take 400 mg by mouth daily.     . metoprolol succinate (TOPROL-XL) 50 MG 24 hr tablet One half tab twice a day 100 tablet 3  . metroNIDAZOLE (METROGEL) 1 % gel Apply topically daily. 90 day supply with refills 180 g 11  . Misc Natural Products (SUPER OAT BRAN) 1000 MG TABS Take 1 tablet by mouth every morning.     . multivitamin (THERAGRAN) per tablet Take 1 tablet by mouth daily.     . niacin (NIASPAN) 1000 MG CR tablet Take 250 mg by mouth every morning.     . phosphorus (K PHOS NEUTRAL) 155-852-130 MG tablet Take 1 tablet by mouth 2 (two) times daily.      Marland Kitchen RESVERATROL 100 MG CAPS Take 1 capsule by mouth daily.     . saw palmetto 160 MG capsule Take 160 mg by mouth 3 (three) times daily.     Marland Kitchen selenium 50 MCG TABS Take 50 mcg by mouth daily.     . simvastatin (ZOCOR) 10 MG tablet Take 1 tablet (10 mg total) by mouth at bedtime. 100 tablet 3  . Turmeric 500 MG CAPS Take 1 capsule by mouth every evening.     . vitamin C (ASCORBIC ACID) 500 MG tablet Take 250 mg by mouth every morning.    . warfarin (COUMADIN) 3 MG tablet Take as directed by coumadin clinic 150 tablet 1  . Yohimbe Bark 500 MG CAPS Take 1 capsule by mouth daily.     . Zinc 50 MG CAPS Take 1 capsule by mouth daily.     No current facility-administered medications on file prior to visit.    ALLERGIES: No Known Allergies  FAMILY HISTORY: Family History  Problem Relation Age of Onset  . Arthritis      family hx  . Colon cancer Neg Hx     SOCIAL HISTORY: History   Social History  . Marital Status: Married    Spouse Name: N/A    Number of Children: N/A  .  Years of Education: N/A   Occupational History  . Not on file.   Social History Main Topics  . Smoking status: Never Smoker   . Smokeless tobacco: Not on file  . Alcohol Use: No  . Drug Use: No  .  Sexual Activity: No   Other Topics Concern  . Not on file   Social History Narrative   Married, retired, gets regular exercise.     REVIEW OF SYSTEMS: Constitutional: No fevers, chills, or sweats, no generalized fatigue, change in appetite Eyes: No visual changes, double vision, eye pain Ear, nose and throat: No hearing loss, ear pain, nasal congestion, sore throat Cardiovascular: No chest pain, palpitations Respiratory:  No shortness of breath at rest or with exertion, wheezes GastrointestinaI: No nausea, vomiting, diarrhea, abdominal pain, fecal incontinence Genitourinary:  No dysuria, urinary retention or frequency Musculoskeletal:  No neck pain, back pain Integumentary: No rash, pruritus, skin lesions Neurological: as above Psychiatric: No depression, insomnia, anxiety Endocrine: No palpitations, fatigue, diaphoresis, mood swings, change in appetite, change in weight, increased thirst Hematologic/Lymphatic:  No anemia, purpura, petechiae. Allergic/Immunologic: no itchy/runny eyes, nasal congestion, recent allergic reactions, rashes  PHYSICAL EXAM: Filed Vitals:   12/07/13 1047  BP: 118/70  Pulse: 74  Temp: 97.7 F (36.5 C)  Resp: 16   General: No acute distress Head:  Normocephalic/atraumatic Eyes:  Fundoscopic exam unremarkable without vessel changes, exudates, hemorrhages or papilledema. Neck: supple, no paraspinal tenderness, full range of motion Heart:  Regular rate and rhythm Lungs:  Clear to auscultation bilaterally Back: No paraspinal tenderness Neurological Exam: alert and oriented to person, place, and time. Attention span and concentration fairly intact, delayed recall poor, and remote memory intact, fund of knowledge intact. Mild deficits with language in  regards to naming.  Repetition and following commands intact.  Montreal Cognitive Assessment  12/07/2013  Visuospatial/ Executive (0/5) 4  Naming (0/3) 2  Attention: Read list of digits (0/2) 2  Attention: Read list of letters (0/1) 0  Attention: Serial 7 subtraction starting at 100 (0/3) 3  Language: Repeat phrase (0/2) 2  Language : Fluency (0/1) 1  Abstraction (0/2) 2  Delayed Recall (0/5) 1  Orientation (0/6) 6  Total 23  Adjusted Score (based on education) 23  CN II-XII intact. Fundoscopic exam unremarkable without vessel changes, exudates, hemorrhages or papilledema.  Bulk and tone normal, muscle strength 5/5 throughout.  Sensation to light touch, temperature and vibration intact.  Deep tendon reflexes 2+ throughout.  Finger to nose intact.  Gait normal.  IMPRESSION: Alzheimer's disease  PLAN: 1.  Restart Aricept 10mg  daily 2.  If tolerating in 4 weeks, will start Namenda 3.  Drive only daylight hours 4.  Follow up in 6 months 30 minutes spent with patient, over 50% spent discussing coordination of care.  Shon MilletAdam Terryn Redner, DO  CC:  Kelle DartingJeffrey Todd, MD

## 2013-12-07 NOTE — Patient Instructions (Signed)
1. Take Aricept 10mg  daily.  In 4 weeks with update and we can start another medication, Namenda, in addition to the Aricept. 2.  Drive only in daylight hours 3.  Follow up in 6 months.

## 2013-12-08 ENCOUNTER — Encounter: Payer: Self-pay | Admitting: Neurology

## 2013-12-08 ENCOUNTER — Other Ambulatory Visit: Payer: Self-pay | Admitting: Cardiovascular Disease

## 2014-01-14 ENCOUNTER — Telehealth: Payer: Self-pay | Admitting: Neurology

## 2014-01-14 ENCOUNTER — Telehealth: Payer: Self-pay | Admitting: *Deleted

## 2014-01-14 NOTE — Telephone Encounter (Signed)
Patient called stating he is having the diarrhea from the new medication you prescribed for him . He says he is willing to keep taking the medication but what would you does he need to do about the diarrhea ? Please advise

## 2014-01-14 NOTE — Telephone Encounter (Signed)
Pt called (was being rude on the phone/yelling/ and not wanting to speak with me). Pt would like to talk to Dr. Everlena CooperJaffe. Pt did not want to inform me on what it was regarding.  C/b [phone]

## 2014-01-14 NOTE — Telephone Encounter (Signed)
I would change to another medication in the same family as Aricept.  I would stop the Aricept and wait to see if the diarrhea stops.  Once it does, we can start galantamine.  I would start with 4mg  twice daily for 4 weeks.  Then he should contact us again with an update and we can increase the dose further.

## 2014-01-14 NOTE — Telephone Encounter (Signed)
[place] [address] [place] [facility] [place] [id number]  patient advised to D/C  aricept until diarrhea has stopped and advised to conetact office and a new medication will be called it  for him

## 2014-02-05 ENCOUNTER — Ambulatory Visit (INDEPENDENT_AMBULATORY_CARE_PROVIDER_SITE_OTHER): Payer: Medicare Other | Admitting: *Deleted

## 2014-02-05 DIAGNOSIS — Z7901 Long term (current) use of anticoagulants: Secondary | ICD-10-CM

## 2014-02-05 DIAGNOSIS — Z5181 Encounter for therapeutic drug level monitoring: Secondary | ICD-10-CM

## 2014-02-05 DIAGNOSIS — I4891 Unspecified atrial fibrillation: Secondary | ICD-10-CM

## 2014-02-05 LAB — POCT INR: INR: 2.9

## 2014-02-05 NOTE — Progress Notes (Signed)
Patient ID: Chad Tucker, male   DOB: 08/08/1933, 81 y.o.   MRN: [id number] Chad Tucker is seen today in followup for anticoagulations atrial fibrillation and hyperlipidemia. He has no documented coronary artery disease. Previous echo EF 50-55% I reviewed his echo from today and EF is normal with mild biatrial enlargement. He has had no signs of congestive failure. He gets very infrequent palpitations. He has not had any chest pain PND orthopnea or lower extremity edema. His anticoagulation has been therapeutic without bleeding. He is on statin therapy with normal LFTs. In February his LDL was approximately 114. This is within target range for someone with no known coronary disease. Otherwise he's been doing well.  We discussed Pradaxa but he agreed to wait until an antidote for it is marketed  Has lost a lot of weight since I last saw him and looks great  5 years of progressive cognitive impairment started on Aricept by neurology 11/15 but caused diarrhea som stopping   Moving to [place] some dyspnea with all the travel and exertion of moving boxes   ROS: Denies fever, malais, weight loss, blurry vision, decreased visual acuity, cough, sputum, SOB, hemoptysis, pleuritic pain, palpitaitons, heartburn, abdominal pain, melena, lower extremity edema, claudication, or rash.  All other systems reviewed and negative  General: Affect appropriate Healthy:  appears stated age HEENT: normal Neck supple with no adenopathy JVP normal no bruits no thyromegaly Lungs clear with no wheezing and good diaphragmatic motion Heart:  S1/S2 no murmur, no rub, gallop or click PMI normal Abdomen: benighn, BS positve, no tenderness, no AAA no bruit.  No HSM or HJR Distal pulses intact with no bruits No edema Neuro non-focal word finding problems and slower speech  Skin warm and dry No muscular weakness   Current Outpatient Prescriptions  Medication Sig Dispense Refill  . allopurinol (ZYLOPRIM) 300 MG tablet Take 1  tablet (300 mg total) by mouth daily. 100 tablet 3  . Black Currant Seed Oil (GAMMA-LINC 500) 500 MG CAPS Take 1 capsule by mouth every morning.     . Boron 3 MG CAPS Take 1 capsule by mouth every morning.     . Bromelains 500 MG TABS Take 1 tablet by mouth every morning.     . cholecalciferol (VITAMIN D) 1000 UNITS tablet Take 5,000 Units by mouth every other day.     . Cinnamon 500 MG capsule Take 500 mg by mouth every morning.     . cyanocobalamin (,VITAMIN B-12,) 1000 MCG/ML injection Inject 1,000 mcg into the muscle every 30 (thirty) days.    Marland Kitchen DHEA 25 MG CAPS Take 1 capsule by mouth every morning.     . donepezil (ARICEPT) 10 MG tablet Take 1 tablet (10 mg total) by mouth at bedtime. 30 tablet 6  . fish oil-omega-3 fatty acids 1000 MG capsule Take 1 g by mouth 3 (three) times daily.     . folic acid (FOLVITE) 800 MCG tablet Take 400 mcg by mouth daily.     . Ginger, Zingiber officinalis, (GINGER ROOT) 550 MG CAPS Take 1 capsule by mouth daily.     . Lutein 20 MG TABS Take 1 tablet by mouth daily.     . magnesium oxide (MAG-OX) 400 MG tablet Take 400 mg by mouth daily.     . metoprolol succinate (TOPROL-XL) 50 MG 24 hr tablet One half tab twice a day 100 tablet 3  . metroNIDAZOLE (METROGEL) 1 % gel Apply topically daily. 90 day supply with refills 180  g 11  . Misc Natural Products (SUPER OAT BRAN) 1000 MG TABS Take 1 tablet by mouth every morning.     . multivitamin (THERAGRAN) per tablet Take 1 tablet by mouth daily.     . niacin (NIASPAN) 1000 MG CR tablet Take 250 mg by mouth every morning.     . phosphorus (K PHOS NEUTRAL) 155-852-130 MG tablet Take 1 tablet by mouth 2 (two) times daily.      Marland Kitchen. RESVERATROL 100 MG CAPS Take 1 capsule by mouth daily.     . saw palmetto 160 MG capsule Take 160 mg by mouth 3 (three) times daily.     Marland Kitchen. selenium 50 MCG TABS Take 50 mcg by mouth daily.     . simvastatin (ZOCOR) 10 MG tablet Take 1 tablet (10 mg total) by mouth at bedtime. 100 tablet 3  .  Turmeric 500 MG CAPS Take 1 capsule by mouth every evening.     . vitamin C (ASCORBIC ACID) 500 MG tablet Take 250 mg by mouth every morning.    . warfarin (COUMADIN) 3 MG tablet Take as directed by coumadin clinic 150 tablet 1  . warfarin (COUMADIN) 3 MG tablet Take 1 tablet (3 mg total) by mouth as directed. 150 tablet 0  . Yohimbe Bark 500 MG CAPS Take 1 capsule by mouth daily.     . Zinc 50 MG CAPS Take 1 capsule by mouth daily.     No current facility-administered medications for this visit.    Allergies  Review of patient's allergies indicates no known allergies.  Electrocardiogram:  05/16/12 Afib rate 86 otherwise normal  Today 02/07/14 afib rate 69 normal ST segments   Assessment and Plan

## 2014-02-07 ENCOUNTER — Encounter: Payer: Self-pay | Admitting: Cardiovascular Disease

## 2014-02-07 ENCOUNTER — Ambulatory Visit (INDEPENDENT_AMBULATORY_CARE_PROVIDER_SITE_OTHER): Payer: Medicare Other | Admitting: Cardiovascular Disease

## 2014-02-07 VITALS — BP 110/70 | HR 69 | Ht 72.0 in | Wt 193.0 lb

## 2014-02-07 DIAGNOSIS — R0602 Shortness of breath: Secondary | ICD-10-CM

## 2014-02-07 DIAGNOSIS — I482 Chronic atrial fibrillation, unspecified: Secondary | ICD-10-CM

## 2014-02-07 DIAGNOSIS — E782 Mixed hyperlipidemia: Secondary | ICD-10-CM

## 2014-02-07 DIAGNOSIS — R413 Other amnesia: Secondary | ICD-10-CM | POA: Insufficient documentation

## 2014-02-07 NOTE — Assessment & Plan Note (Signed)
Progressive  aricept not helpful f/u Jaffe consider namenda

## 2014-02-07 NOTE — Assessment & Plan Note (Signed)
Lungs clear no edema  Check echo for EF with chronic afib

## 2014-02-07 NOTE — Assessment & Plan Note (Signed)
Well controlled.  Continue current medications and low sodium Dash type diet.    

## 2014-02-07 NOTE — Assessment & Plan Note (Signed)
Cholesterol is at goal.  Continue current dose of statin and diet Rx.  No myalgias or side effects.  F/U  LFT's in 6 months. Lab Results  Component Value Date   LDLCALC 110* 05/22/2013

## 2014-02-07 NOTE — Patient Instructions (Signed)
Your physician has requested that you have an echocardiogram. Echocardiography is a painless test that uses sound waves to create images of your heart. It provides your doctor with information about the size and shape of your heart and how well your heart's chambers and valves are working. This procedure takes approximately one hour. There are no restrictions for this procedure.  Your physician recommends that you continue on your current medications as directed. Please refer to the Current Medication list given to you today.  Your physician wants you to follow-up in: 6 month ov You will receive a reminder letter in the mail two months in advance. If you don't receive a letter, please call our office to schedule the follow-up appointment.  

## 2014-02-07 NOTE — Assessment & Plan Note (Signed)
Good rate control and anticoagulation no change in ECG no palpitations No falls  Has had some dysp[place] check echo and make sure EF still low normal

## 2014-02-14 ENCOUNTER — Ambulatory Visit (HOSPITAL_COMMUNITY): Payer: Medicare Other | Attending: Cardiology | Admitting: Radiology

## 2014-02-14 DIAGNOSIS — E785 Hyperlipidemia, unspecified: Secondary | ICD-10-CM | POA: Diagnosis not present

## 2014-02-14 DIAGNOSIS — I482 Chronic atrial fibrillation, unspecified: Secondary | ICD-10-CM

## 2014-02-14 DIAGNOSIS — I10 Essential (primary) hypertension: Secondary | ICD-10-CM | POA: Diagnosis not present

## 2014-02-14 DIAGNOSIS — R0602 Shortness of breath: Secondary | ICD-10-CM

## 2014-02-14 NOTE — Progress Notes (Signed)
Echocardiogram performed.  

## 2014-02-18 ENCOUNTER — Telehealth: Payer: Self-pay | Admitting: *Deleted

## 2014-02-18 ENCOUNTER — Telehealth: Payer: Self-pay | Admitting: Neurology

## 2014-02-18 NOTE — Telephone Encounter (Signed)
Pt called wanting to speak to a nurse regarding his meds. (he does not remember the name) C/b [phone]

## 2014-02-18 NOTE — Telephone Encounter (Signed)
patient called stating he no longer has  diarrhea since he stopped the aricept he states he is feeling really well and is asking if youare wanting him to try any other medication ?  please advise

## 2014-02-18 NOTE — Telephone Encounter (Signed)
we can start galantamine. I would start with 4mg  twice daily for 4 weeks. Then he should contact us again with an update and we can increase the dose further.

## 2014-02-20 ENCOUNTER — Encounter: Payer: Self-pay | Admitting: Internal Medicine

## 2014-02-20 ENCOUNTER — Ambulatory Visit (INDEPENDENT_AMBULATORY_CARE_PROVIDER_SITE_OTHER): Payer: Medicare Other | Admitting: Internal Medicine

## 2014-02-20 VITALS — BP 130/82 | HR 90 | Temp 98.9°F | Ht 73.0 in | Wt 201.5 lb

## 2014-02-20 DIAGNOSIS — M109 Gout, unspecified: Secondary | ICD-10-CM

## 2014-02-20 MED ORDER — METHYLPREDNISOLONE ACETATE 80 MG/ML IJ SUSP
120.0000 mg | Freq: Once | INTRAMUSCULAR | Status: AC
Start: 2014-02-20 — End: 2014-02-20
  Administered 2014-02-20: 120 mg via INTRAMUSCULAR

## 2014-02-20 MED ORDER — PREDNISONE 10 MG PO TABS: ORAL_TABLET | ORAL | Status: AC

## 2014-02-20 NOTE — Progress Notes (Signed)
Pre visit review using our clinic review tool, if applicable. No additional management support is needed unless otherwise documented below in the visit note. 

## 2014-02-20 NOTE — Patient Instructions (Signed)
You had the steroid shot today  Please take all new medication as prescribed - the prednisone  OK to take the allopurinol at 300 mg per day  Please continue all other medications as before, and refills have been done if requested.  Please have the pharmacy call with any other refills you may need.  Please keep your appointments with your specialists as you may have planned

## 2014-02-20 NOTE — Progress Notes (Signed)
Subjective:    Patient ID: Chad RossettiWilliam G Kau, male    DOB: [birth year], 81 y.o.   MRN: [id number]  HPI  Here with acute onset 3 days severe left knee pain, swelling without truama, fever, worse to bear wt, better to sit, with hx of left foot gout approx 5 yrs ago.  Has been taking allopurinol since then, but admits only taking half per day in last few months as he thought he was doing well.   Pt denies fever, wt loss, night sweats, loss of appetite, or other constitutional symptoms  Pt denies chest pain, increased sob or doe, wheezing, orthopnea, PND, increased LE swelling, palpitations, dizziness or syncope.   Pt denies polydipsia, polyuria, Past Medical History  Diagnosis Date  . Essential hypertension, benign   . Atrial fibrillation   . HLD (hyperlipidemia)     mixed  . Long term (current) use of anticoagulants     coumadin therapy   . Pure hypercholesterolemia   . Dyspnea   . DDD (degenerative disc disease), cervical     w/radioculopathy  . Rosacea   . ED (erectile dysfunction) of organic origin   . Heart failure     congestive. secondary to afib   . NICM (nonischemic cardiomyopathy)     EF of 30%  . Renal disorder     kidney stones   Past Surgical History  Procedure Laterality Date  . Appendectomy    . Tonsillectomy    . Control hemorrhage nasal      after nasal septal repair    reports that he has never smoked. He does not have any smokeless tobacco history on file. He reports that he does not drink alcohol or use illicit drugs. family history includes Arthritis in an other family member. There is no history of Colon cancer. No Known Allergies Current Outpatient Prescriptions on File Prior to Visit  Medication Sig Dispense Refill  . allopurinol (ZYLOPRIM) 300 MG tablet Take 1 tablet (300 mg total) by mouth daily. 100 tablet 3  . Black Currant Seed Oil (GAMMA-LINC 500) 500 MG CAPS Take 1 capsule by mouth every morning.     . Boron 3 MG CAPS Take 1 capsule by mouth every  morning.     . Bromelains 500 MG TABS Take 1 tablet by mouth every morning.     . cholecalciferol (VITAMIN D) 1000 UNITS tablet Take 5,000 Units by mouth every other day.     . Cinnamon 500 MG capsule Take 500 mg by mouth every morning.     . cyanocobalamin (,VITAMIN B-12,) 1000 MCG/ML injection Inject 1,000 mcg into the muscle every 30 (thirty) days.    Marland Kitchen. DHEA 25 MG CAPS Take 1 capsule by mouth every morning.     . fish oil-omega-3 fatty acids 1000 MG capsule Take 1 g by mouth 3 (three) times daily.     . folic acid (FOLVITE) 800 MCG tablet Take 400 mcg by mouth daily.     . Ginger, Zingiber officinalis, (GINGER ROOT) 550 MG CAPS Take 1 capsule by mouth daily.     . Lutein 20 MG TABS Take 1 tablet by mouth daily.     . magnesium oxide (MAG-OX) 400 MG tablet Take 400 mg by mouth daily.     . metoprolol succinate (TOPROL-XL) 50 MG 24 hr tablet One half tab twice a day 100 tablet 3  . Misc Natural Products (SUPER OAT BRAN) 1000 MG TABS Take 1 tablet by mouth every morning.     .Marland Kitchen  multivitamin (THERAGRAN) per tablet Take 1 tablet by mouth daily.     . niacin (NIASPAN) 1000 MG CR tablet Take 250 mg by mouth every morning.     . phosphorus (K PHOS NEUTRAL) 155-852-130 MG tablet Take 1 tablet by mouth 2 (two) times daily.      Marland Kitchen RESVERATROL 100 MG CAPS Take 1 capsule by mouth daily.     . saw palmetto 160 MG capsule Take 160 mg by mouth 3 (three) times daily.     Marland Kitchen selenium 50 MCG TABS Take 50 mcg by mouth daily.     . simvastatin (ZOCOR) 10 MG tablet Take 1 tablet (10 mg total) by mouth at bedtime. 100 tablet 3  . Turmeric 500 MG CAPS Take 1 capsule by mouth every evening.     . vitamin C (ASCORBIC ACID) 500 MG tablet Take 250 mg by mouth every morning.    . warfarin (COUMADIN) 3 MG tablet Take 1 tablet (3 mg total) by mouth as directed. 150 tablet 0  . Yohimbe Bark 500 MG CAPS Take 1 capsule by mouth daily.     . Zinc 50 MG CAPS Take 1 capsule by mouth daily.     No current facility-administered  medications on file prior to visit.   Review of Systems All otherwise neg per pt     Objective:   Physical Exam BP 130/82 mmHg  Pulse 90  Temp(Src) 98.9 F (37.2 C) (Oral)  Ht  (1.854 m)  Wt 201 lb 8 oz (91.4 kg)  BMI 26.59 kg/m2  SpO2 96% VS noted, non toxic Constitutional: Pt appears well-developed, well-nourished.  HENT: Head: NCAT.  Right Ear: External ear normal.  Left Ear: External ear normal.  Eyes: . Pupils are equal, round, and reactive to light. Conjunctivae and EOM are normal Neck: Normal range of motion. Neck supple.  Cardiovascular: Normal rate and regular rhythm.   Pulmonary/Chest: Effort normal and breath sounds without rales or wheezing.  Left knee with 2-3+ effusion/tender/warmth with minor erythema Neurological: Pt is alert. Not confused , motor grossly intact Skin: Skin is warm. No rash Psychiatric: Pt behavior is normal. No agitation.     Assessment & Plan:

## 2014-02-23 NOTE — Assessment & Plan Note (Addendum)
Moderate to severe, likely related to reduced preventive med recently, not improved with colchicine as well, for depomedrol IM, predpac asd, pain control , increase allopurinol to 300 qd,  to f/u any worsening symptoms or concerns

## 2014-02-24 ENCOUNTER — Other Ambulatory Visit: Payer: Self-pay | Admitting: Cardiovascular Disease

## 2014-03-19 ENCOUNTER — Ambulatory Visit (INDEPENDENT_AMBULATORY_CARE_PROVIDER_SITE_OTHER): Payer: Medicare Other | Admitting: *Deleted

## 2014-03-19 DIAGNOSIS — I4891 Unspecified atrial fibrillation: Secondary | ICD-10-CM

## 2014-03-19 DIAGNOSIS — Z7901 Long term (current) use of anticoagulants: Secondary | ICD-10-CM

## 2014-03-19 DIAGNOSIS — Z5181 Encounter for therapeutic drug level monitoring: Secondary | ICD-10-CM

## 2014-03-19 LAB — POCT INR: INR: 3.4

## 2014-03-30 ENCOUNTER — Other Ambulatory Visit: Payer: Self-pay | Admitting: Cardiovascular Disease

## 2014-04-26 ENCOUNTER — Telehealth: Payer: Self-pay

## 2014-04-26 NOTE — Telephone Encounter (Signed)
[place] but has not established with a primary care MD or a cardiologist.  Advised pt he will need to establish with a local MD to monitor and manage his Coumadin ASAP.  Advised pt I would fax a one time order to [facility] for PT/INR, but pt would need to establish with a local MD for further f/u.  Pt verbalized understanding, will call results to his cell phone [phone].

## 2014-05-02 LAB — PROTIME-INR: INR: 1.8 — AB (ref <=1.1)

## 2014-05-06 ENCOUNTER — Ambulatory Visit: Payer: Self-pay | Admitting: Internal Medicine

## 2014-05-06 DIAGNOSIS — I4891 Unspecified atrial fibrillation: Secondary | ICD-10-CM

## 2014-05-06 DIAGNOSIS — Z5181 Encounter for therapeutic drug level monitoring: Secondary | ICD-10-CM

## 2014-06-10 ENCOUNTER — Ambulatory Visit: Payer: Medicare Other | Admitting: Neurology

## 2014-09-04 ENCOUNTER — Encounter: Payer: Self-pay | Admitting: Gastroenterology

## 2015-01-23 ENCOUNTER — Encounter: Payer: Self-pay | Admitting: *Deleted

## 2015-02-17 IMAGING — CT CT ABD-PELV W/O CM
1 series · 15 of 25 positions shown, 19 images · non-contrast
Comparison: CT 09/06/2011

CLINICAL DATA: Left-sided flank pain

CT ABDOMEN AND PELVIS WITHOUT CONTRAST
TECHNIQUE: Multidetector CT imaging of the abdomen and pelvis was
performed following the standard protocol without intravenous
contrast.

[Series 4: lung · axial · 0.86mm/px · z∈[-70,+40]mm · 15 of 25 slices shown, 19 images]
[im 2/25  soft-tissue]
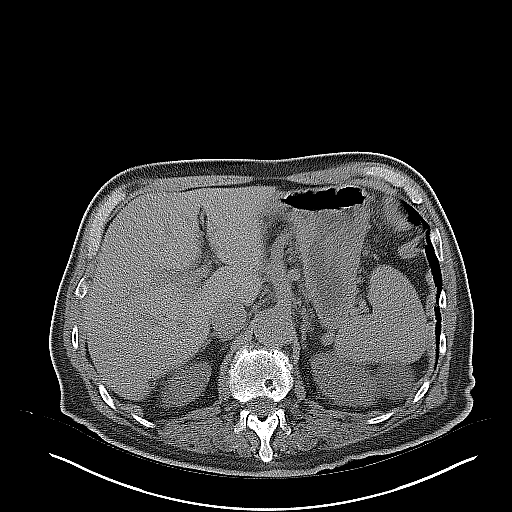
[im 2/25  bone]
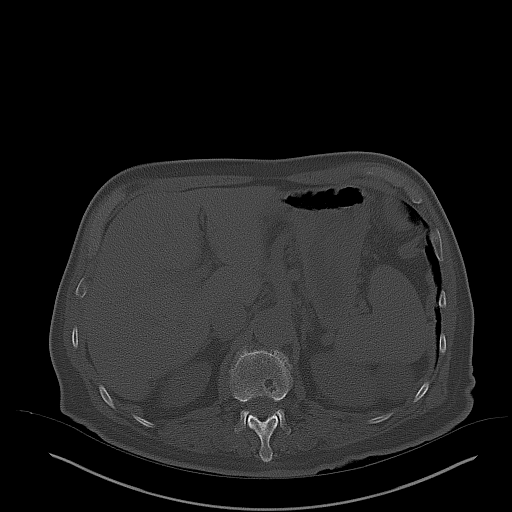
[im 4/25  soft-tissue]
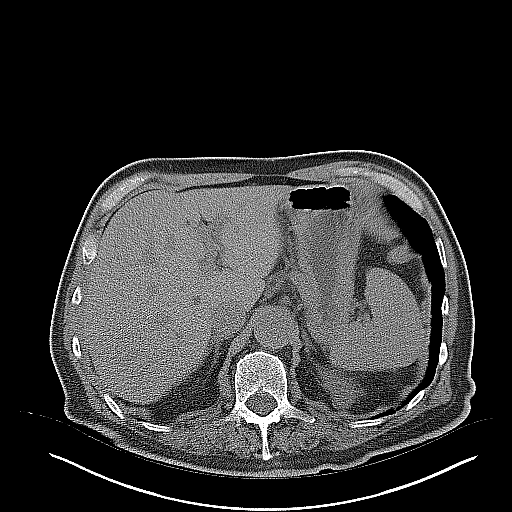
[im 6/25  soft-tissue]
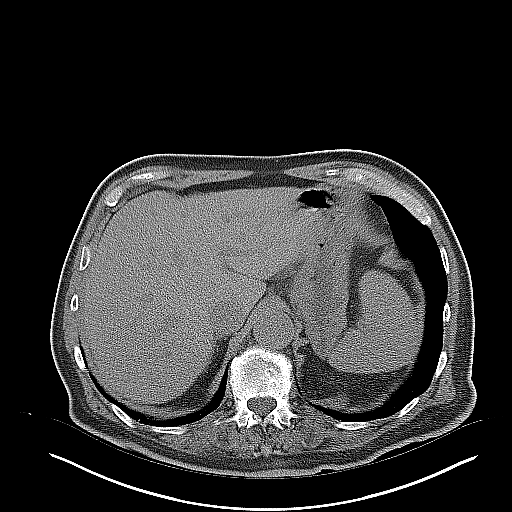
[im 8/25  soft-tissue]
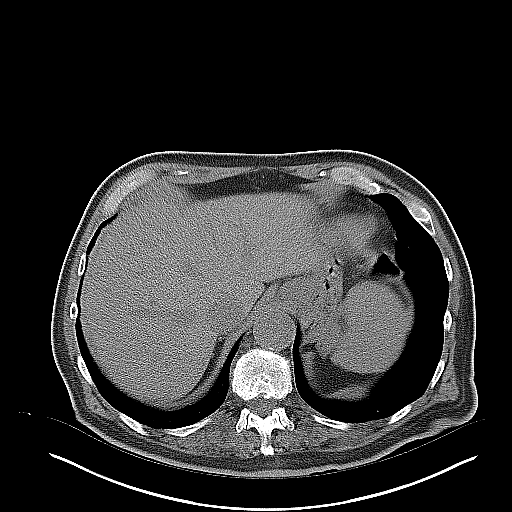
[im 9/25  soft-tissue]
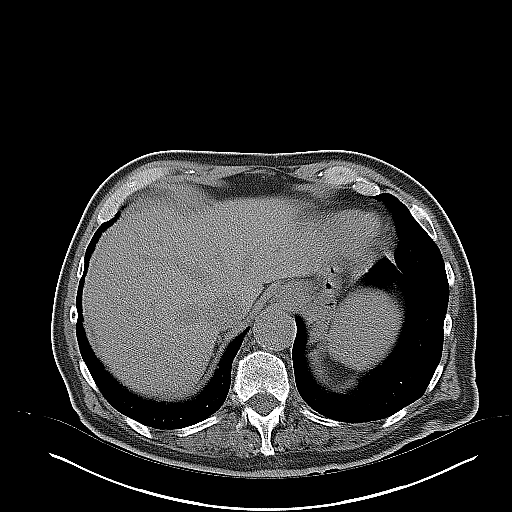
[im 11/25  soft-tissue]
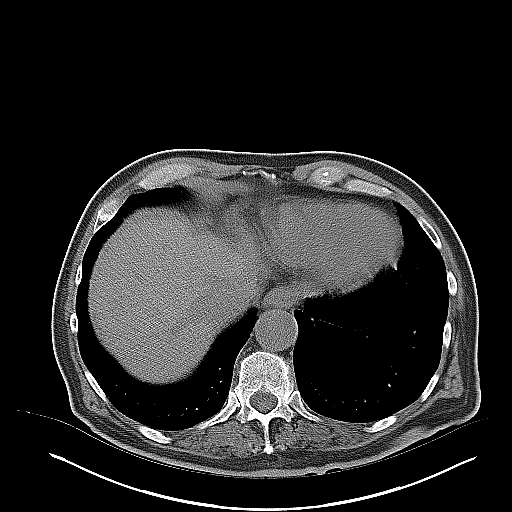
[im 13/25  soft-tissue]
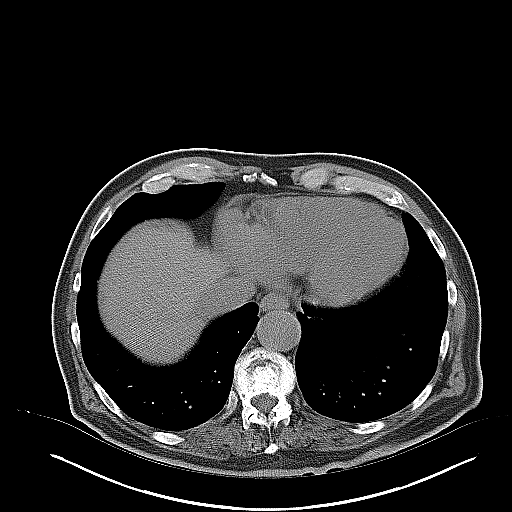
[im 15/25  soft-tissue]
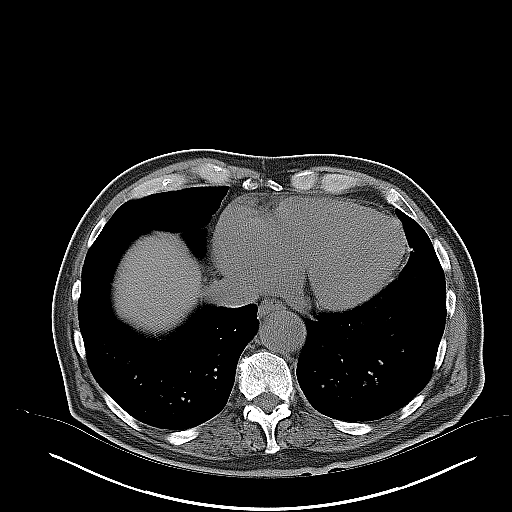
[im 17/25  soft-tissue]
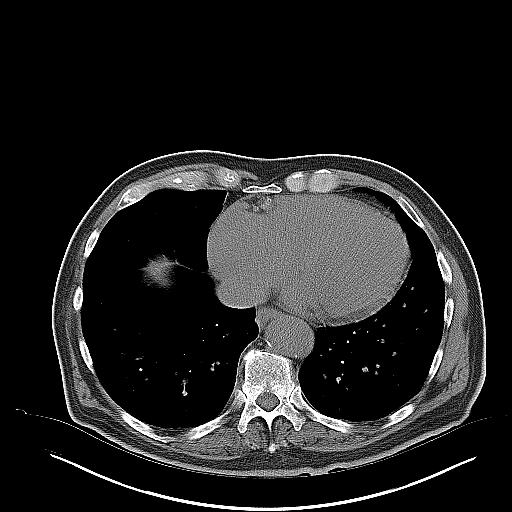
[im 17/25  bone]
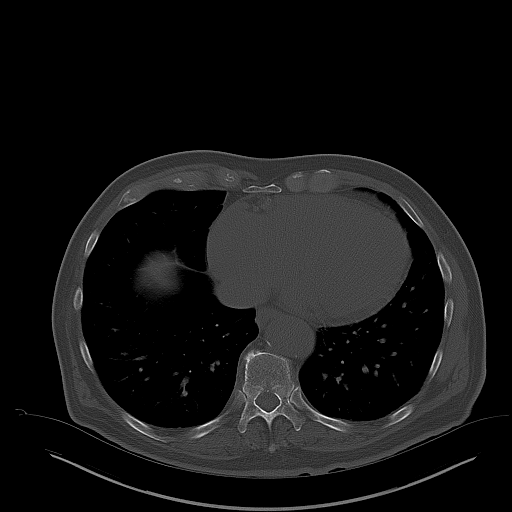
[im 18/25  soft-tissue]
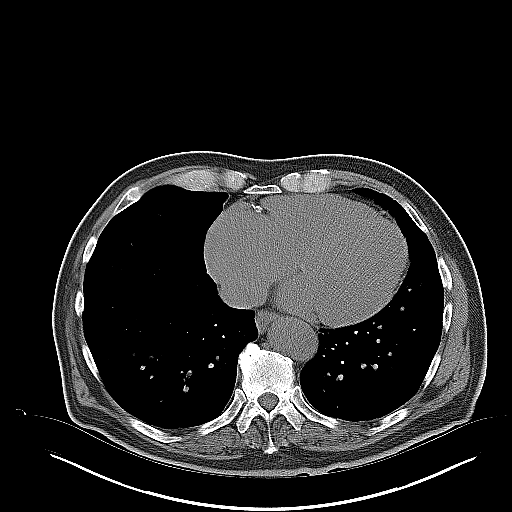
[im 20/25  soft-tissue]
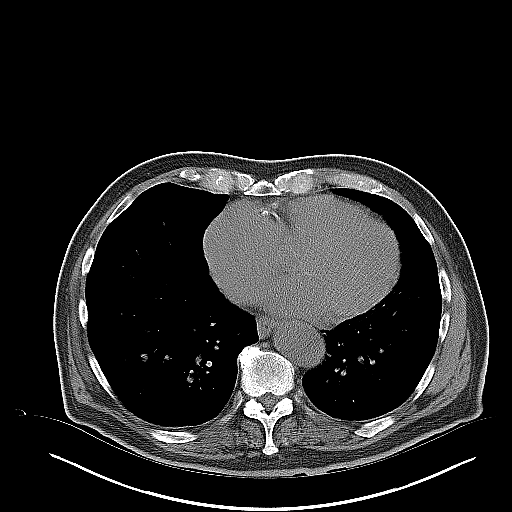
[im 21/25  lung]
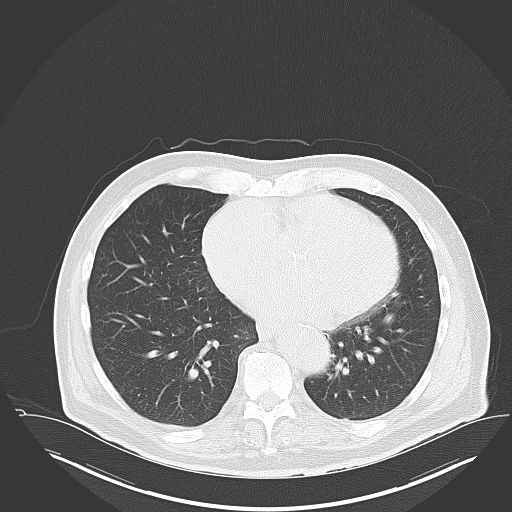
[im 22/25  soft-tissue]
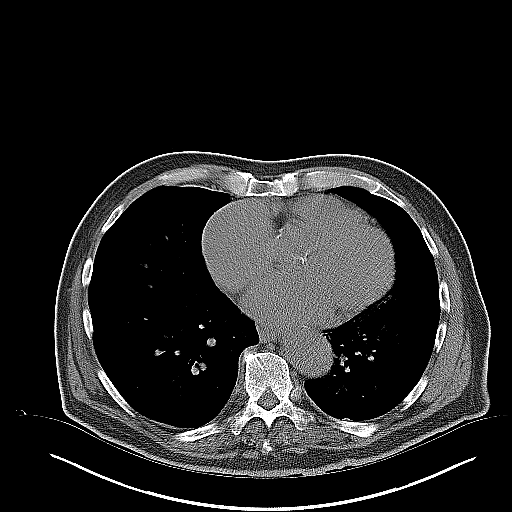
[im 22/25  lung]
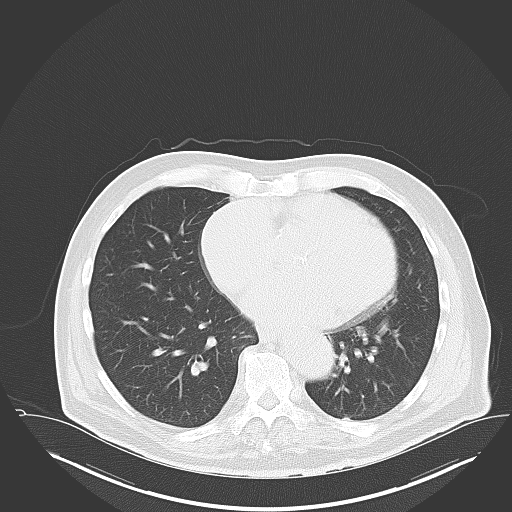
[im 23/25  lung]
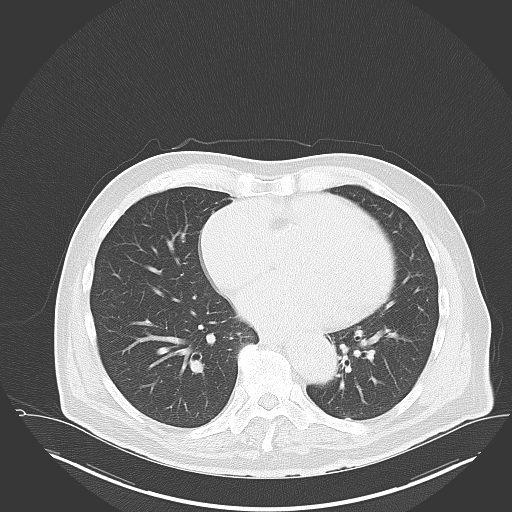
[im 24/25  soft-tissue]
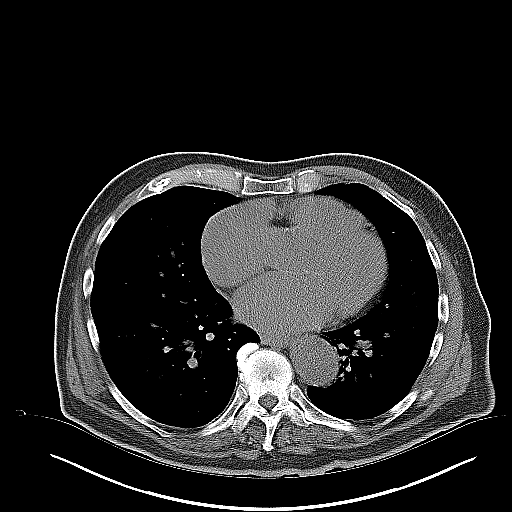
[im 24/25  lung]
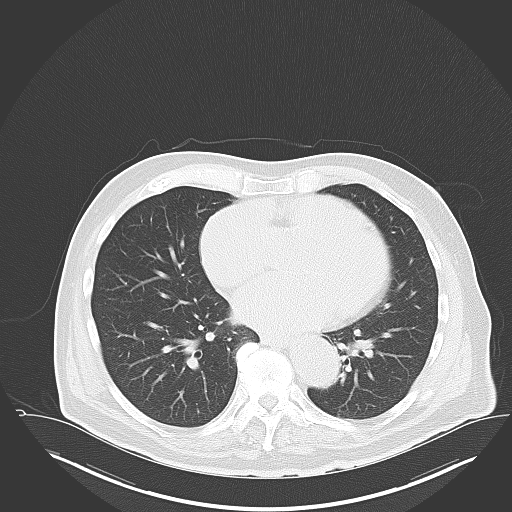

[15 of 25 positions shown; findings below may reference images not displayed]

FINDINGS: Renal:  Nonobstructing calculus in the mid left kidney.  There is a
low density cyst in the left kidney.  There is mild hydro nephrosis
and hydroureter on the left.  This is secondary to a partially
obstructing calculus in the distal left ureter.  This calculus
measures 3 mm  at the vesicoureteral junction.  No right
nephrolithiasis or ureterolithiasis.

Lung bases are clear.  No focal hepatic lesion.  The gallbladder,
pancreas, spleen, adrenal glands normal.  The stomach, small bowel,
and cecum are normal.  The colon demonstrates several diverticula
without acute inflammation.

No free fluid the pelvis.  The prostate gland is normal.  No pelvic
lymphadenopathy abdominal lymphadenopathy.

Review of  bone windows demonstrates no aggressive osseous lesions.
IMPRESSION: Calculus in the distal left ureter at the vesicle ureteral junction
with mild obstructive uropathy.

## 2016-10-15 ENCOUNTER — Encounter: Payer: Self-pay | Admitting: Family Medicine

## 2023-08-26 DEATH — deceased
